# Patient Record
Sex: Female | Born: 1970
Health system: Southern US, Community
[De-identification: ages and names within clinical notes are randomized; demographics above are authoritative.]

## PROBLEM LIST (undated history)

## (undated) DIAGNOSIS — K449 Diaphragmatic hernia without obstruction or gangrene: Secondary | ICD-10-CM

## (undated) DIAGNOSIS — G4730 Sleep apnea, unspecified: Secondary | ICD-10-CM

## (undated) DIAGNOSIS — K760 Fatty (change of) liver, not elsewhere classified: Secondary | ICD-10-CM

## (undated) DIAGNOSIS — F32A Depression, unspecified: Secondary | ICD-10-CM

## (undated) DIAGNOSIS — E119 Type 2 diabetes mellitus without complications: Secondary | ICD-10-CM

## (undated) DIAGNOSIS — D649 Anemia, unspecified: Secondary | ICD-10-CM

## (undated) DIAGNOSIS — T7840XA Allergy, unspecified, initial encounter: Secondary | ICD-10-CM

## (undated) DIAGNOSIS — E669 Obesity, unspecified: Secondary | ICD-10-CM

## (undated) DIAGNOSIS — M1990 Unspecified osteoarthritis, unspecified site: Secondary | ICD-10-CM

## (undated) DIAGNOSIS — K219 Gastro-esophageal reflux disease without esophagitis: Secondary | ICD-10-CM

## (undated) DIAGNOSIS — F419 Anxiety disorder, unspecified: Secondary | ICD-10-CM

## (undated) DIAGNOSIS — M810 Age-related osteoporosis without current pathological fracture: Secondary | ICD-10-CM

## (undated) DIAGNOSIS — I10 Essential (primary) hypertension: Secondary | ICD-10-CM

## (undated) HISTORY — DX: Obesity, unspecified: E66.9

## (undated) HISTORY — DX: Anxiety disorder, unspecified: F41.9

## (undated) HISTORY — DX: Anemia, unspecified: D64.9

## (undated) HISTORY — PX: HAND SURGERY: SHX662

## (undated) HISTORY — DX: Age-related osteoporosis without current pathological fracture: M81.0

## (undated) HISTORY — DX: Unspecified osteoarthritis, unspecified site: M19.90

## (undated) HISTORY — PX: TUBAL LIGATION: SHX77

## (undated) HISTORY — DX: Diaphragmatic hernia without obstruction or gangrene: K44.9

## (undated) HISTORY — DX: Fatty (change of) liver, not elsewhere classified: K76.0

## (undated) HISTORY — DX: Allergy, unspecified, initial encounter: T78.40XA

## (undated) HISTORY — DX: Gastro-esophageal reflux disease without esophagitis: K21.9

## (undated) HISTORY — DX: Type 2 diabetes mellitus without complications: E11.9

## (undated) HISTORY — DX: Depression, unspecified: F32.A

## (undated) HISTORY — DX: Sleep apnea, unspecified: G47.30

## (undated) HISTORY — PX: COLONOSCOPY: SHX174

---

## 2017-05-26 HISTORY — PX: ABDOMINAL HYSTERECTOMY: SHX81

## 2018-08-02 LAB — HM COLONOSCOPY

## 2019-04-01 ENCOUNTER — Ambulatory Visit: Payer: Self-pay | Admitting: Adult Health Nurse Practitioner

## 2019-04-01 ENCOUNTER — Other Ambulatory Visit: Payer: Self-pay

## 2019-04-01 ENCOUNTER — Encounter: Payer: Self-pay | Admitting: Adult Health Nurse Practitioner

## 2019-04-05 ENCOUNTER — Other Ambulatory Visit: Payer: Self-pay

## 2019-04-05 ENCOUNTER — Encounter: Payer: Self-pay | Admitting: Adult Health Nurse Practitioner

## 2019-04-05 ENCOUNTER — Telehealth (INDEPENDENT_AMBULATORY_CARE_PROVIDER_SITE_OTHER): Payer: 59 | Admitting: Adult Health Nurse Practitioner

## 2019-04-05 VITALS — Ht 66.0 in | Wt 292.0 lb

## 2019-04-05 DIAGNOSIS — M5417 Radiculopathy, lumbosacral region: Secondary | ICD-10-CM

## 2019-04-05 DIAGNOSIS — M48062 Spinal stenosis, lumbar region with neurogenic claudication: Secondary | ICD-10-CM | POA: Diagnosis not present

## 2019-04-05 DIAGNOSIS — M5020 Other cervical disc displacement, unspecified cervical region: Secondary | ICD-10-CM | POA: Insufficient documentation

## 2019-04-05 MED ORDER — CITALOPRAM HYDROBROMIDE 40 MG PO TABS: 40.0000 mg | ORAL_TABLET | Freq: Every day | ORAL | 56 refills | Status: DC

## 2019-04-05 MED ORDER — METHOCARBAMOL 500 MG PO TABS: 500.0000 mg | ORAL_TABLET | Freq: Four times a day (QID) | ORAL | 0 refills | Status: DC | PRN

## 2019-04-05 MED ORDER — OMEPRAZOLE 20 MG PO CPDR: 20.0000 mg | DELAYED_RELEASE_CAPSULE | Freq: Every day | ORAL | 6 refills | Status: DC

## 2019-04-05 MED ORDER — METHYLPREDNISOLONE 4 MG PO TBPK: ORAL_TABLET | ORAL | 0 refills | Status: DC

## 2019-04-06 ENCOUNTER — Ambulatory Visit: Payer: 59

## 2019-04-08 ENCOUNTER — Encounter: Payer: Self-pay | Admitting: Adult Health Nurse Practitioner

## 2019-04-08 DIAGNOSIS — M48062 Spinal stenosis, lumbar region with neurogenic claudication: Secondary | ICD-10-CM

## 2019-04-08 HISTORY — DX: Spinal stenosis, lumbar region with neurogenic claudication: M48.062

## 2019-04-08 NOTE — Progress Notes (Signed)
Telemedicine Encounter- SOAP NOTE Established Patient  This telephone encounter was conducted with the patient's (or proxy's) verbal consent via audio telecommunications: yes/no: Yes Patient was instructed to have this encounter in a suitably private space; and to only have persons present to whom they give permission to participate. In addition, patient identity was confirmed by use of name plus two identifiers (DOB and address).  I discussed the limitations, risks, security and privacy concerns of performing an evaluation and management service by telephone and the availability of in person appointments. I also discussed with the patient that there may be a patient responsible charge related to this service. The patient expressed understanding and agreed to proceed.  I spent a total of TIME; 0 MIN TO 60 MIN: 15 minutes talking with the patient or their proxy.  Chief Complaint  Patient presents with  . New Patient (Initial Visit)  . Back Pain    Pt stated having lower back pain (herniated Disk)--painful over 1 year. Pt had cat scan in [place].    Subjective   Jasmine Wu is a 48 y.o. established patient. Telephone visit today for low back pain   HPI   Patient with a hx of low back pain x 1 year with increased pain radiating posteriorly down legs bilaterally.  Worse with changing positions.  Worse with staying in one position for too long.  Mild improvement with OTC NSAIDs.  Notes that a scan  Of abdomen showed a herniated dis at L5-S1.  Has had few exacerbations since coming here No bowel or bladder incontinence.     Patient Active Problem List   Diagnosis Date Noted  . Spinal stenosis, lumbar region, with neurogenic claudication 04/08/2019    Past Medical History:  Diagnosis Date  . Allergy   . Spinal stenosis, lumbar region, with neurogenic claudication 04/08/2019    Current Outpatient Medications  Medication Sig Dispense Refill  . citalopram (CELEXA) 40 MG  tablet Take 1 tablet (40 mg total) by mouth daily. 30 tablet 56  . Naproxen Sodium (ALEVE PO) Take by mouth.    Marland Kitchen omeprazole (PRILOSEC) 20 MG capsule Take 1 capsule (20 mg total) by mouth daily. 30 capsule 6  . Polyethylene Glycol 3350 (MIRALAX PO) Take by mouth.    . methocarbamol (ROBAXIN) 500 MG tablet Take 1 tablet (500 mg total) by mouth every 6 (six) hours as needed for muscle spasms. 30 tablet 0  . methylPREDNISolone (MEDROL DOSEPAK) 4 MG TBPK tablet As directed on pkg label 21 each 0   No current facility-administered medications for this visit.     No Known Allergies  Social History   Socioeconomic History  . Marital status: Married    Spouse name: Not on file  . Number of children: 4  . Years of education: Not on file  . Highest education level: Not on file  Occupational History  . Not on file  Social Needs  . Financial resource strain: Not on file  . Food insecurity    Worry: Not on file    Inability: Not on file  . Transportation needs    Medical: Not on file    Non-medical: Not on file  Tobacco Use  . Smoking status: Never Smoker  . Smokeless tobacco: Never Used  Substance and Sexual Activity  . Alcohol use: Not Currently    Frequency: Never  . Drug use: Not Currently  . Sexual activity: Not on file  Lifestyle  . Physical activity    Days  per week: Not on file    Minutes per session: Not on file  . Stress: Not on file  Relationships  . Social Herbalist on phone: Not on file    Gets together: Not on file    Attends religious service: Not on file    Active member of club or organization: Not on file    Attends meetings of clubs or organizations: Not on file    Relationship status: Not on file  . Intimate partner violence    Fear of current or ex partner: Not on file    Emotionally abused: Not on file    Physically abused: Not on file    Forced sexual activity: Not on file  Other Topics Concern  . Not on file  Social History Narrative  .  Not on file    ROS  General ROS: negative for - malaise, night sweats or sleep disturbance  Neurological ROS: negative for - gait disturbance or weakness + for radiating pain from back to bilateral LEs  Musculoskeletal ROS: positive for - pain in back - bilateral, generalized, lower negative for - gait disturbance or muscular weakness   Objective     GEN: , NAD, Non-toxic, Alert & Oriented x 3  PSYCH: Normally interactive. Conversant. Not depressed or anxious appearing.  Calm demeanor.    Vitals as reported by the patient: Today's Vitals   04/05/19 0937  Weight: 292 lb (132.5 kg)  Height: 5\' 6"  (1.676 m)    Jasmine was seen today for new patient (initial visit) and back pain.  Diagnoses and all orders for this visit:  Lumbosacral radiculopathy at S1 -     methocarbamol (ROBAXIN) 500 MG tablet; Take 1 tablet (500 mg total) by mouth every 6 (six) hours as needed for muscle spasms. -     Ambulatory referral to Physical Therapy  Spinal stenosis, lumbar region, with neurogenic claudication -     methocarbamol (ROBAXIN) 500 MG tablet; Take 1 tablet (500 mg total) by mouth every 6 (six) hours as needed for muscle spasms. -     Ambulatory referral to Physical Therapy  Other orders -     citalopram (CELEXA) 40 MG tablet; Take 1 tablet (40 mg total) by mouth daily. -     omeprazole (PRILOSEC) 20 MG capsule; Take 1 capsule (20 mg total) by mouth daily. -     methylPREDNISolone (MEDROL DOSEPAK) 4 MG TBPK tablet; As directed on pkg label     I discussed the assessment and treatment plan with the patient. The patient was provided an opportunity to ask questions and all were answered. The patient agreed with the plan and demonstrated an understanding of the instructions.   The patient was advised to call back or seek an in-person evaluation if the symptoms worsen or if the condition fails to improve as anticipated.  I provided 15 minutes of non-face-to-face time during this  encounter.  Glyn Ade, NP  Primary Care at [facility]

## 2019-04-18 ENCOUNTER — Ambulatory Visit (INDEPENDENT_AMBULATORY_CARE_PROVIDER_SITE_OTHER): Payer: 59 | Admitting: Family Medicine

## 2019-04-18 ENCOUNTER — Other Ambulatory Visit: Payer: Self-pay

## 2019-04-18 DIAGNOSIS — Z131 Encounter for screening for diabetes mellitus: Secondary | ICD-10-CM

## 2019-04-18 DIAGNOSIS — Z1329 Encounter for screening for other suspected endocrine disorder: Secondary | ICD-10-CM

## 2019-04-18 DIAGNOSIS — Z23 Encounter for immunization: Secondary | ICD-10-CM

## 2019-04-18 DIAGNOSIS — Z13220 Encounter for screening for lipoid disorders: Secondary | ICD-10-CM

## 2019-04-19 LAB — COMPREHENSIVE METABOLIC PANEL
ALT: 23 IU/L (ref 0–32)
AST: 16 IU/L (ref 0–40)
Albumin/Globulin Ratio: 1.6 (ref 1.2–2.2)
Albumin: 4.1 g/dL (ref 3.8–4.8)
Alkaline Phosphatase: 79 IU/L (ref 39–117)
BUN/Creatinine Ratio: 22 (ref 9–23)
BUN: 17 mg/dL (ref 6–24)
Bilirubin Total: 0.4 mg/dL (ref 0.0–1.2)
CO2: 22 mmol/L (ref 20–29)
Calcium: 9.1 mg/dL (ref 8.7–10.2)
Chloride: 101 mmol/L (ref 96–106)
Creatinine, Ser: 0.76 mg/dL (ref 0.57–1.00)
GFR calc Af Amer: 106 mL/min/{1.73_m2} (ref >59)
GFR calc non Af Amer: 92 mL/min/{1.73_m2} (ref >59)
Globulin, Total: 2.5 g/dL (ref 1.5–4.5)
Glucose: 102 mg/dL — ABNORMAL HIGH (ref 65–99)
Potassium: 4.7 mmol/L (ref 3.5–5.2)
Sodium: 138 mmol/L (ref 134–144)
Total Protein: 6.6 g/dL (ref 6.0–8.5)

## 2019-04-19 LAB — CBC WITH DIFFERENTIAL/PLATELET
Basophils Absolute: 0.1 10*3/uL (ref 0.0–0.2)
Basos: 1 %
EOS (ABSOLUTE): 0.3 10*3/uL (ref 0.0–0.4)
Eos: 4 %
Hematocrit: 40.6 % (ref 34.0–46.6)
Hemoglobin: 12.6 g/dL (ref 11.1–15.9)
Immature Grans (Abs): 0.1 10*3/uL (ref 0.0–0.1)
Immature Granulocytes: 1 %
Lymphocytes Absolute: 2.3 10*3/uL (ref 0.7–3.1)
Lymphs: 25 %
MCH: 25.8 pg — ABNORMAL LOW (ref 26.6–33.0)
MCHC: 31 g/dL — ABNORMAL LOW (ref 31.5–35.7)
MCV: 83 fL (ref 79–97)
Monocytes Absolute: 0.5 10*3/uL (ref 0.1–0.9)
Monocytes: 5 %
Neutrophils Absolute: 6 10*3/uL (ref 1.4–7.0)
Neutrophils: 64 %
Platelets: 374 10*3/uL (ref 150–450)
RBC: 4.88 x10E6/uL (ref 3.77–5.28)
RDW: 13.2 % (ref 11.7–15.4)
WBC: 9.2 10*3/uL (ref 3.4–10.8)

## 2019-04-19 LAB — LIPID PANEL
Chol/HDL Ratio: 3.5 ratio (ref 0.0–4.4)
Cholesterol, Total: 156 mg/dL (ref 100–199)
HDL: 45 mg/dL (ref >39)
LDL Chol Calc (NIH): 93 mg/dL (ref 0–99)
Triglycerides: 95 mg/dL (ref 0–149)
VLDL Cholesterol Cal: 18 mg/dL (ref 5–40)

## 2019-04-19 LAB — HEMOGLOBIN A1C
Est. average glucose Bld gHb Est-mCnc: 128 mg/dL
Hgb A1c MFr Bld: 6.1 % — ABNORMAL HIGH (ref 4.8–5.6)

## 2019-04-19 LAB — TSH: TSH: 1.27 u[IU]/mL (ref 0.450–4.500)

## 2019-05-04 ENCOUNTER — Encounter: Payer: Self-pay | Admitting: Adult Health Nurse Practitioner

## 2019-05-04 ENCOUNTER — Other Ambulatory Visit: Payer: Self-pay

## 2019-05-04 ENCOUNTER — Telehealth: Payer: 59 | Admitting: Adult Health Nurse Practitioner

## 2019-05-04 NOTE — Patient Instructions (Signed)
° ° ° °  If you have lab work done today you will be contacted with your lab results within the next 2 weeks.  If you have not heard from us then please contact us. The fastest way to get your results is to register for My Chart. ° ° °IF you received an x-ray today, you will receive an invoice from [facility]. Please contact [facility] at [phone] with questions or concerns regarding your invoice.  ° °IF you received labwork today, you will receive an invoice from [facility]. Please contact [facility] at [phone] with questions or concerns regarding your invoice.  ° °Our billing staff will not be able to assist you with questions regarding bills from these companies. ° °You will be contacted with the lab results as soon as they are available. The fastest way to get your results is to activate your My Chart account. Instructions are located on the last page of this paperwork. If you have not heard from us regarding the results in 2 weeks, please contact this office. °  ° ° ° °

## 2019-05-04 NOTE — Progress Notes (Unsigned)
On-going Back pain for over a year.   Back pain getting worse.   Wants to reschedule for later date due to connection issues with the phone.

## 2019-05-05 ENCOUNTER — Other Ambulatory Visit: Payer: Self-pay

## 2019-05-05 ENCOUNTER — Telehealth (INDEPENDENT_AMBULATORY_CARE_PROVIDER_SITE_OTHER): Payer: 59 | Admitting: Adult Health Nurse Practitioner

## 2019-05-05 ENCOUNTER — Encounter: Payer: Self-pay | Admitting: Adult Health Nurse Practitioner

## 2019-05-05 DIAGNOSIS — M5416 Radiculopathy, lumbar region: Secondary | ICD-10-CM

## 2019-05-05 HISTORY — DX: Radiculopathy, lumbar region: M54.16

## 2019-05-05 NOTE — Progress Notes (Signed)
Telemedicine Encounter- SOAP NOTE Established Patient  This telephone encounter was conducted with the patient's (or proxy's) verbal consent via audio telecommunications: yes/no: Yes Patient was instructed to have this encounter in a suitably private space; and to only have persons present to whom they give permission to participate. In addition, patient identity was confirmed by use of name plus two identifiers (DOB and address).  I discussed the limitations, risks, security and privacy concerns of performing an evaluation and management service by telephone and the availability of in person appointments. I also discussed with the patient that there may be a patient responsible charge related to this service. The patient expressed understanding and agreed to proceed.  I spent a total of TIME; 0 MIN TO 60 MIN: 10 minutes talking with the patient or their proxy.  Chief Complaint  Patient presents with  . severe back pain x 1 yr.    Pt has other issues to discuss with provider but didn't disclose those issues to [place] is a 48 y.o. established patient. Telephone visit today for chronic low back pain with radiculopathy.  HPI  Patient reports that the last steroid pack improved but as soon as the medication was completed she had a return of her pain.  Pain is actually getting worse with constant pain to her lower back bilaterally radiating posteriorly down the legs in an S1 distribution.  She cannot even stand to cook for long in her kitchen was standing up.  She is wondering about what the next step would be.  We did review physical therapy and and potential imaging.  Her current problem was only found because of an incidental finding on abdominal CT scan that she had in [place].  She has no new symptoms motor weakness or bowel bladder changes.  She does note that the Robaxin helps at night.  Encourage the patient to take ibuprofen 800 mg as needed every 8  hours as needed for her low back pain and radiation.  Patient Active Problem List   Diagnosis Date Noted  . Lumbar radiculopathy, chronic 05/05/2019    Past Medical History:  Diagnosis Date  . Allergy   . Lumbar radiculopathy, chronic 05/05/2019  . Spinal stenosis, lumbar region, with neurogenic claudication 04/08/2019    Current Outpatient Medications  Medication Sig Dispense Refill  . citalopram (CELEXA) 40 MG tablet Take 1 tablet (40 mg total) by mouth daily. 30 tablet 56  . methocarbamol (ROBAXIN) 500 MG tablet Take 1 tablet (500 mg total) by mouth every 6 (six) hours as needed for muscle spasms. 30 tablet 0  . Naproxen Sodium (ALEVE PO) Take by mouth.    Jasmine Wu Kitchen omeprazole (PRILOSEC) 20 MG capsule Take 1 capsule (20 mg total) by mouth daily. 30 capsule 6  . Polyethylene Glycol 3350 (MIRALAX PO) Take by mouth.     No current facility-administered medications for this visit.    No Known Allergies  Social History   Socioeconomic History  . Marital status: Married    Spouse name: Not on file  . Number of children: 4  . Years of education: Not on file  . Highest education level: Not on file  Occupational History  . Not on file  Tobacco Use  . Smoking status: Never Smoker  . Smokeless tobacco: Never Used  Substance and Sexual Activity  . Alcohol use: Not Currently  . Drug use: Not Currently  . Sexual activity: Not on file  Other Topics  Concern  . Not on file  Social History Narrative  . Not on file   Social Determinants of Health   Financial Resource Strain:   . Difficulty of Paying Living Expenses: Not on file  Food Insecurity:   . Worried About Programme researcher, broadcasting/film/video in the Last Year: Not on file  . Ran Out of Food in the Last Year: Not on file  Transportation Needs:   . Lack of Transportation (Medical): Not on file  . Lack of Transportation (Non-Medical): Not on file  Physical Activity:   . Days of Exercise per Week: Not on file  . Minutes of Exercise per  Session: Not on file  Stress:   . Feeling of Stress : Not on file  Social Connections:   . Frequency of Communication with Friends and Family: Not on file  . Frequency of Social Gatherings with Friends and Family: Not on file  . Attends Religious Services: Not on file  . Active Member of Clubs or Organizations: Not on file  . Attends Banker Meetings: Not on file  . Marital Status: Not on file  Intimate Partner Violence:   . Fear of Current or Ex-Partner: Not on file  . Emotionally Abused: Not on file  . Physically Abused: Not on file  . Sexually Abused: Not on file    ROS    ROS  General ROS: negative for - malaise, night sweats or sleep disturbance  Neurological ROS: negative for - gait disturbance or weakness + for radiating pain from back to bilateral LEs  Musculoskeletal ROS: positive for - pain in back - bilateral, generalized, lower negative for - gait disturbance or muscular weakness  Objective     GEN: , NAD, Non-toxic, Alert & Oriented x 3  PSYCH: Normally interactive. Conversant. Not depressed or anxious appearing.  Calm demeanor.   Vitals as reported by the patient: There were no vitals filed for this visit.  Jasmine Wu was seen today for severe back pain x 1 yr..  Diagnoses and all orders for this visit:  Lumbar radiculopathy, chronic   Given the patient's chronic problem that has lasted over 1 year and her incidental finding, would proceed with physical therapy.  It would also not be unreasonable to get neurological imaging to determine if she does have a herniated disc that is impinging her S1 nerve root.  Okay ahead and order a consult physical therapy as well as an MRI.  We will follow-up once completed and determine if surgical referral is warranted.  She is in line with this plan.    I discussed the assessment and treatment plan with the patient. The patient was provided an opportunity to ask questions and all were answered. The patient  agreed with the plan and demonstrated an understanding of the instructions.   The patient was advised to call back or seek an in-person evaluation if the symptoms worsen or if the condition fails to improve as anticipated.  I provided  minutes of non-face-to-face time during this encounter.  Elyse Jarvis, NP  Primary Care at [facility]

## 2019-05-05 NOTE — Patient Instructions (Signed)
° ° ° °  If you have lab work done today you will be contacted with your lab results within the next 2 weeks.  If you have not heard from us then please contact us. The fastest way to get your results is to register for My Chart. ° ° °IF you received an x-ray today, you will receive an invoice from [facility]. Please contact [facility] at [phone] with questions or concerns regarding your invoice.  ° °IF you received labwork today, you will receive an invoice from [facility]. Please contact [facility] at [phone] with questions or concerns regarding your invoice.  ° °Our billing staff will not be able to assist you with questions regarding bills from these companies. ° °You will be contacted with the lab results as soon as they are available. The fastest way to get your results is to activate your My Chart account. Instructions are located on the last page of this paperwork. If you have not heard from us regarding the results in 2 weeks, please contact this office. °  ° ° ° °

## 2019-05-05 NOTE — Progress Notes (Signed)
CC: Severe back pain x 1 year. Pain level 8/10, pain is constant and dull.  Per pt she has other issues she would like to discuss with provider but did not disclose.  No travel outside the [place] or [place] in the past 3 weeks.

## 2019-05-10 ENCOUNTER — Encounter: Payer: Self-pay | Admitting: Family Medicine

## 2019-05-11 NOTE — Telephone Encounter (Signed)
PT CALLED IN REGARD TO REFERRAL. PLEASE PROCESS FOR PT

## 2019-05-12 ENCOUNTER — Other Ambulatory Visit: Payer: 59

## 2019-05-18 ENCOUNTER — Ambulatory Visit: Payer: 59 | Admitting: Family Medicine

## 2019-05-18 ENCOUNTER — Ambulatory Visit: Payer: 59 | Admitting: Adult Health Nurse Practitioner

## 2019-05-23 ENCOUNTER — Other Ambulatory Visit: Payer: 59

## 2019-05-24 ENCOUNTER — Ambulatory Visit
Admission: RE | Admit: 2019-05-24 | Discharge: 2019-05-24 | Disposition: A | Discharge status: Home / Self Care | Payer: 59 | Source: Ambulatory Visit | Attending: Adult Health Nurse Practitioner | Admitting: Adult Health Nurse Practitioner

## 2019-05-24 DIAGNOSIS — M5416 Radiculopathy, lumbar region: Secondary | ICD-10-CM

## 2019-06-02 ENCOUNTER — Telehealth (INDEPENDENT_AMBULATORY_CARE_PROVIDER_SITE_OTHER): Payer: 59 | Admitting: Adult Health Nurse Practitioner

## 2019-06-02 ENCOUNTER — Other Ambulatory Visit: Payer: Self-pay

## 2019-06-02 DIAGNOSIS — M47817 Spondylosis without myelopathy or radiculopathy, lumbosacral region: Secondary | ICD-10-CM | POA: Diagnosis not present

## 2019-06-02 DIAGNOSIS — M5417 Radiculopathy, lumbosacral region: Secondary | ICD-10-CM

## 2019-06-02 DIAGNOSIS — Z712 Person consulting for explanation of examination or test findings: Secondary | ICD-10-CM

## 2019-06-02 NOTE — Patient Instructions (Signed)
° ° ° °  If you have lab work done today you will be contacted with your lab results within the next 2 weeks.  If you have not heard from us then please contact us. The fastest way to get your results is to register for My Chart. ° ° °IF you received an x-ray today, you will receive an invoice from [facility]. Please contact [facility] at [phone] with questions or concerns regarding your invoice.  ° °IF you received labwork today, you will receive an invoice from [facility]. Please contact [facility] at [phone] with questions or concerns regarding your invoice.  ° °Our billing staff will not be able to assist you with questions regarding bills from these companies. ° °You will be contacted with the lab results as soon as they are available. The fastest way to get your results is to activate your My Chart account. Instructions are located on the last page of this paperwork. If you have not heard from us regarding the results in 2 weeks, please contact this office. °  ° ° ° °

## 2019-06-08 ENCOUNTER — Telehealth: Payer: Self-pay | Admitting: Adult Health Nurse Practitioner

## 2019-06-08 NOTE — Telephone Encounter (Signed)
Can you please sign the notes for encounter 06/02/19 so that I may send referral? Thanks

## 2019-06-09 NOTE — Telephone Encounter (Signed)
Please Advise

## 2019-06-13 ENCOUNTER — Encounter: Payer: Self-pay | Admitting: Family Medicine

## 2019-06-13 ENCOUNTER — Ambulatory Visit: Payer: 59

## 2019-06-13 NOTE — Telephone Encounter (Signed)
Can you please sign the notes from 01/07 so I can send referral  Thanks

## 2019-06-13 NOTE — Telephone Encounter (Signed)
Please sign the 06/02/19 office visit so the patient's referral can be completed.

## 2019-06-14 NOTE — Telephone Encounter (Signed)
Please Advise

## 2019-06-16 NOTE — Telephone Encounter (Signed)
Please have the provider sign the 06/02/19 office visit so the patient's referral can be completed.

## 2019-06-16 NOTE — Telephone Encounter (Signed)
done

## 2019-06-16 NOTE — Progress Notes (Signed)
Telemedicine Encounter- SOAP NOTE Established Patient  This telephone encounter was conducted with the patient's (or proxy's) verbal consent via audio telecommunications: yes/no: Yes Patient was instructed to have this encounter in a suitably private space; and to only have persons present to whom they give permission to participate. In addition, patient identity was confirmed by use of name plus two identifiers (DOB and address).  I discussed the limitations, risks, security and privacy concerns of performing an evaluation and management service by telephone and the availability of in person appointments. I also discussed with the patient that there may be a patient responsible charge related to this service. The patient expressed understanding and agreed to proceed.  I spent a total of TIME; 0 MIN TO 60 MIN: 15 minutes talking with the patient or their proxy.  Chief Complaint  Patient presents with  . Results    MRI    Subjective   Jasmine Wu is a 49 y.o. established patient. Telephone visit today for MRI results.  HPI   Patient is still having nearly constant pain in her lower back.  Some S1 radicular symptoms.  We discussed the results of the MRI report below.    Patient Active Problem List   Diagnosis Date Noted  . Lumbar radiculopathy, chronic 05/05/2019    Past Medical History:  Diagnosis Date  . Allergy   . Lumbar radiculopathy, chronic 05/05/2019  . Spinal stenosis, lumbar region, with neurogenic claudication 04/08/2019    Current Outpatient Medications  Medication Sig Dispense Refill  . citalopram (CELEXA) 40 MG tablet Take 1 tablet (40 mg total) by mouth daily. 30 tablet 56  . methocarbamol (ROBAXIN) 500 MG tablet Take 1 tablet (500 mg total) by mouth every 6 (six) hours as needed for muscle spasms. 30 tablet 0  . Naproxen Sodium (ALEVE PO) Take by mouth.    Marland Kitchen omeprazole (PRILOSEC) 20 MG capsule Take 1 capsule (20 mg total) by mouth daily. 30 capsule 6    . Polyethylene Glycol 3350 (MIRALAX PO) Take by mouth.     No current facility-administered medications for this visit.    No Known Allergies  Social History   Socioeconomic History  . Marital status: Married    Spouse name: Not on file  . Number of children: 4  . Years of education: Not on file  . Highest education level: Not on file  Occupational History  . Not on file  Tobacco Use  . Smoking status: Never Smoker  . Smokeless tobacco: Never Used  Substance and Sexual Activity  . Alcohol use: Not Currently  . Drug use: Not Currently  . Sexual activity: Not on file  Other Topics Concern  . Not on file  Social History Narrative  . Not on file   Social Determinants of Health   Financial Resource Strain:   . Difficulty of Paying Living Expenses: Not on file  Food Insecurity:   . Worried About Charity fundraiser in the Last Year: Not on file  . Ran Out of Food in the Last Year: Not on file  Transportation Needs:   . Lack of Transportation (Medical): Not on file  . Lack of Transportation (Non-Medical): Not on file  Physical Activity:   . Days of Exercise per Week: Not on file  . Minutes of Exercise per Session: Not on file  Stress:   . Feeling of Stress : Not on file  Social Connections:   . Frequency of Communication with Friends and Family: Not  on file  . Frequency of Social Gatherings with Friends and Family: Not on file  . Attends Religious Services: Not on file  . Active Member of Clubs or Organizations: Not on file  . Attends Banker Meetings: Not on file  . Marital Status: Not on file  Intimate Partner Violence:   . Fear of Current or Ex-Partner: Not on file  . Emotionally Abused: Not on file  . Physically Abused: Not on file  . Sexually Abused: Not on file    ROS   Review of Systems See HPI Constitution: No fevers or chills No malaise No diaphoresis Skin: No rash or itching Eyes: no blurry vision, no double vision GU: no dysuria or  hematuria Neuro: no dizziness or headaches   Objective    General appearance: oriented to person, place, and time. Mental Status: normal mood, behavior, speech, dress, motor activity, and thought processes.   Personally reviewed MRI of the lumbar spine which shows:   No central canal stenosis.  Facet hypertrophy at L4-L5, L5-S1, Mild foraminal stenosis R>L at L4-S1.   Vitals as reported by the patient: There were no vitals filed for this visit.  Jasmine Wu was seen today for results.  Diagnoses and all orders for this visit:  Lumbosacral radiculopathy at S1 -     Ambulatory referral to Neurosurgery -     Ambulatory referral to Physical Therapy  Facet joint disease of lumbosacral region -     Ambulatory referral to Neurosurgery -     Ambulatory referral to Physical Therapy   Reviewed symptoms and treatment to date.  Recommend the above.  She is inline with this plan.   I discussed the assessment and treatment plan with the patient. The patient was provided an opportunity to ask questions and all were answered. The patient agreed with the plan and demonstrated an understanding of the instructions.   The patient was advised to call back or seek an in-person evaluation if the symptoms worsen or if the condition fails to improve as anticipated.  I provided 15 minutes of non-face-to-face time during this encounter.  Elyse Jarvis, NP  Primary Care at [facility]

## 2019-06-16 NOTE — Telephone Encounter (Signed)
Please sign

## 2019-07-19 ENCOUNTER — Other Ambulatory Visit: Payer: Self-pay

## 2019-07-19 ENCOUNTER — Encounter: Payer: Self-pay | Admitting: Adult Health Nurse Practitioner

## 2019-07-19 ENCOUNTER — Ambulatory Visit: Payer: 59 | Admitting: Adult Health Nurse Practitioner

## 2019-07-19 VITALS — BP 158/96 | HR 91 | Temp 98.0°F | Ht 64.0 in | Wt 304.4 lb

## 2019-07-19 DIAGNOSIS — F4323 Adjustment disorder with mixed anxiety and depressed mood: Secondary | ICD-10-CM

## 2019-07-19 DIAGNOSIS — I10 Essential (primary) hypertension: Secondary | ICD-10-CM

## 2019-07-19 MED ORDER — LOSARTAN POTASSIUM-HCTZ 100-25 MG PO TABS: 1.0000 | ORAL_TABLET | Freq: Every day | ORAL | 3 refills | Status: DC

## 2019-07-19 MED ORDER — QUETIAPINE FUMARATE 25 MG PO TABS: 25.0000 mg | ORAL_TABLET | Freq: Every day | ORAL | 3 refills | Status: DC

## 2019-07-19 MED ORDER — PAROXETINE HCL 30 MG PO TABS: 30.0000 mg | ORAL_TABLET | Freq: Every day | ORAL | 3 refills | Status: DC

## 2019-07-19 NOTE — Patient Instructions (Addendum)
   Managing Your Hypertension Hypertension is commonly called high blood pressure. This is when the force of your blood pressing against the walls of your arteries is too strong. Arteries are blood vessels that carry blood from your heart throughout your body. Hypertension forces the heart to work harder to pump blood, and may cause the arteries to become narrow or stiff. Having untreated or uncontrolled hypertension can cause heart attack, stroke, kidney disease, and other problems. What are blood pressure readings? A blood pressure reading consists of a higher number over a lower number. Ideally, your blood pressure should be below 120/80. The first ("top") number is called the systolic pressure. It is a measure of the pressure in your arteries as your heart beats. The second ("bottom") number is called the diastolic pressure. It is a measure of the pressure in your arteries as the heart relaxes. What does my blood pressure reading mean? Blood pressure is classified into four stages. Based on your blood pressure reading, your health care provider may use the following stages to determine what type of treatment you need, if any. Systolic pressure and diastolic pressure are measured in a unit called mm Hg. Normal  Systolic pressure: below 120.  Diastolic pressure: below 80. Elevated  Systolic pressure: 120-129.  Diastolic pressure: below 80. Hypertension stage 1  Systolic pressure: 130-139.  Diastolic pressure: 80-89. Hypertension stage 2  Systolic pressure: 140 or above.  Diastolic pressure: 90 or above. What health risks are associated with hypertension? Managing your hypertension is an important responsibility. Uncontrolled hypertension can lead to:  A heart attack.  A stroke.  A weakened blood vessel (aneurysm).  Heart failure.  Kidney damage.  Eye damage.  Metabolic syndrome.  Memory and concentration problems. What changes can I make to manage my  hypertension? Hypertension can be managed by making lifestyle changes and possibly by taking medicines. Your health care provider will help you make a plan to bring your blood pressure within a normal range. Eating and drinking   Eat a diet that is high in fiber and potassium, and low in salt (sodium), added sugar, and fat. An example eating plan is called the DASH (Dietary Approaches to Stop Hypertension) diet. To eat this way: ? Eat plenty of fresh fruits and vegetables. Try to fill half of your plate at each meal with fruits and vegetables. ? Eat whole grains, such as whole wheat pasta, brown rice, or whole grain bread. Fill about one quarter of your plate with whole grains. ? Eat low-fat diary products. ? Avoid fatty cuts of meat, processed or cured meats, and poultry with skin. Fill about one quarter of your plate with lean proteins such as fish, chicken without skin, beans, eggs, and tofu. ? Avoid premade and processed foods. These tend to be higher in sodium, added sugar, and fat.  Reduce your daily sodium intake. Most people with hypertension should eat less than 1,500 mg of sodium a day.  Limit alcohol intake to no more than 1 drink a day for nonpregnant women and 2 drinks a day for men. One drink equals 12 oz of beer, 5 oz of wine, or 1 oz of hard liquor. Lifestyle  Work with your health care provider to maintain a healthy body weight, or to lose weight. Ask what an ideal weight is for you.  Get at least 30 minutes of exercise that causes your heart to beat faster (aerobic exercise) most days of the week. Activities may include walking, swimming, or biking.    Include exercise to strengthen your muscles (resistance exercise), such as weight lifting, as part of your weekly exercise routine. Try to do these types of exercises for 30 minutes at least 3 days a week.  Do not use any products that contain nicotine or tobacco, such as cigarettes and e-cigarettes. If you need help quitting,  ask your health care provider.  Control any long-term (chronic) conditions you have, such as high cholesterol or diabetes. Monitoring  Monitor your blood pressure at home as told by your health care provider. Your personal target blood pressure may vary depending on your medical conditions, your age, and other factors.  Have your blood pressure checked regularly, as often as told by your health care provider. Working with your health care provider  Review all the medicines you take with your health care provider because there may be side effects or interactions.  Talk with your health care provider about your diet, exercise habits, and other lifestyle factors that may be contributing to hypertension.  Visit your health care provider regularly. Your health care provider can help you create and adjust your plan for managing hypertension. Will I need medicine to control my blood pressure? Your health care provider may prescribe medicine if lifestyle changes are not enough to get your blood pressure under control, and if:  Your systolic blood pressure is 130 or higher.  Your diastolic blood pressure is 80 or higher. Take medicines only as told by your health care provider. Follow the directions carefully. Blood pressure medicines must be taken as prescribed. The medicine does not work as well when you skip doses. Skipping doses also puts you at risk for problems. Contact a health care provider if:  You think you are having a reaction to medicines you have taken.  You have repeated (recurrent) headaches.  You feel dizzy.  You have swelling in your ankles.  You have trouble with your vision. Get help right away if:  You develop a severe headache or confusion.  You have unusual weakness or numbness, or you feel faint.  You have severe pain in your chest or abdomen.  You vomit repeatedly.  You have trouble breathing. Summary  Hypertension is when the force of blood pumping  through your arteries is too strong. If this condition is not controlled, it may put you at risk for serious complications.  Your personal target blood pressure may vary depending on your medical conditions, your age, and other factors. For most people, a normal blood pressure is less than 120/80.  Hypertension is managed by lifestyle changes, medicines, or both. Lifestyle changes include weight loss, eating a healthy, low-sodium diet, exercising more, and limiting alcohol. This information is not intended to replace advice given to you by your health care provider. Make sure you discuss any questions you have with your health care provider. Document Revised: 09/03/2018 Document Reviewed: 04/09/2016 Elsevier Patient Education  2020 Elsevier Inc.  

## 2019-07-19 NOTE — Progress Notes (Signed)
07/19/2019  French Ana 08-20-1970 [id number]  Subjective:  Jasmine Wu is a 49 y.o. female with anxiety/depression, and new onset elevated BP.   Reports that she has had a lot going on at home.  Increased anxiety and depression but primarily anxiety.  Major move from [place] due to husband losing job.  Patient lost house.  Cousin had a stroke, aunt has a mass in lung, and daughter's fiance just called off wedding this past weekend.  She finds herself crying and not sleeping.  Cannot shut off her mind.  She is going to seek counseling through her employment.   She has been on Citalopram for about 15 years and does not feel that it is effective any longer.  The repercussions from not sleeping are making patient more emotional.   She does endorse some chest pain--unsure if organic or from anxiety.  Her father had an MI at 74.  Lipids checked in the fall were normal.      Current Outpatient Medications  Medication Sig Dispense Refill  . methocarbamol (ROBAXIN) 500 MG tablet Take 1 tablet (500 mg total) by mouth every 6 (six) hours as needed for muscle spasms. 30 tablet 0  . Naproxen Sodium (ALEVE PO) Take by mouth.    Marland Kitchen omeprazole (PRILOSEC) 20 MG capsule Take 1 capsule (20 mg total) by mouth daily. 30 capsule 6  . Polyethylene Glycol 3350 (MIRALAX PO) Take by mouth.    . citalopram (CELEXA) 40 MG tablet Take 1 tablet (40 mg total) by mouth daily. (Patient not taking: Reported on 07/19/2019) 30 tablet 56   No current facility-administered medications for this visit.     General ROS: positive for  - malaise and sleep disturbance negative for - chills, hot flashes or night sweats Respiratory ROS: positive for - shortness of breath negative for - cough, orthopnea or tachypnea  Hypertension ROS: noting swelling of ankles.  New concerns: chest pain without radiation, some DOE intermittently. Marland Kitchen  Psychological ROS: positive for - anxiety, concentration difficulties, depression  and sleep disturbances negative for - memory difficulties, mood swings or obsessive thoughts   GAD 7 : Generalized Anxiety Score 07/19/2019  Nervous, Anxious, on Edge 1  Control/stop worrying 3  Worry too much - different things 3  Trouble relaxing 1  Restless 1  Easily annoyed or irritable 3  Afraid - awful might happen 0  Total GAD 7 Score 12      Office Visit from 07/19/2019 in Primary Care at [facility]  PHQ-9 Total Score  9       Objective:  BP (!) 158/96 (BP Location: Right Arm, Patient Position: Sitting, Cuff Size: Large)   Pulse 91   Temp 98 F (36.7 C) (Temporal)   Ht 5\' 4"  (1.626 m)   Wt (!) 304 lb 6.4 oz (138.1 kg)   SpO2 96%   BMI 52.25 kg/m   Appearance oriented to person, place, and time and crying. General exam BP noted to be moderately elevated today in office, S1, S2 normal, no gallop, no murmur, chest clear, no JVD, no HSM, no edema, CVS exam  - normal rate, regular rhythm, normal S1, S2, no murmurs, rubs, clicks or gallops.  Lab review: labs are reviewed, up to date and normal.   Assessment:   Hypertension needs improvement.  1. Essential hypertension   2. Adjustment disorder with mixed anxiety and depressed mood    Meds ordered this encounter  Medications  . losartan-hydrochlorothiazide (HYZAAR) 100-25 MG tablet  Sig: Take 1 tablet by mouth daily.    Dispense:  90 tablet    Refill:  3  . PARoxetine (PAXIL) 30 MG tablet    Sig: Take 1 tablet (30 mg total) by mouth daily.    Dispense:  30 tablet    Refill:  3  . QUEtiapine (SEROQUEL) 25 MG tablet    Sig: Take 1 tablet (25 mg total) by mouth at bedtime.    Dispense:  30 tablet    Refill:  3   Reviewed side effects of SSRIs and sleep medications.  Informed Seroquel could make patient fatigued initially.  Recommended trying on the weekend first and giving self 8 hours to sleep.  Paxil may be more effective for anxiety though if no improvement, would consider Effexor.    Discussed getting some BP  readings at home as she does have a cuff.  It would help in managing medication based on accurate BP readings in the future.  She is inline with this plan.  When she returns in 3 weeks, would repeat labs.  She is inline with this plan.   Elyse Jarvis, NP   Plan:  Lab results and schedule of future lab studies reviewed with patient. Orders and follow up as documented in patient record. Reviewed medications and side effects in detail. Follow up: 1 month and as needed.Marland Kitchen

## 2019-08-16 ENCOUNTER — Ambulatory Visit: Payer: 59 | Admitting: Adult Health Nurse Practitioner

## 2019-09-13 ENCOUNTER — Other Ambulatory Visit: Payer: Self-pay

## 2019-09-13 ENCOUNTER — Encounter: Payer: Self-pay | Admitting: Adult Health Nurse Practitioner

## 2019-09-13 ENCOUNTER — Telehealth (INDEPENDENT_AMBULATORY_CARE_PROVIDER_SITE_OTHER): Payer: 59 | Admitting: Adult Health Nurse Practitioner

## 2019-09-13 VITALS — BP 111/70 | Ht 64.0 in | Wt 300.0 lb

## 2019-09-13 DIAGNOSIS — L309 Dermatitis, unspecified: Secondary | ICD-10-CM

## 2019-09-13 MED ORDER — HYDROXYZINE PAMOATE 25 MG PO CAPS: 25.0000 mg | ORAL_CAPSULE | Freq: Three times a day (TID) | ORAL | 0 refills | Status: AC | PRN

## 2019-09-13 MED ORDER — METHYLPREDNISOLONE 4 MG PO TBPK: ORAL_TABLET | ORAL | 0 refills | Status: DC

## 2019-09-13 NOTE — Progress Notes (Signed)
Virtual Visit via Video Note  I connected with Jasmine Wu on 09/13/19 at  3:50 PM EDT by a video enabled telemedicine application and verified that I am speaking with the correct person using two identifiers.  Location: Patient: home Provider: work   I discussed the limitations of evaluation and management by telemedicine and the availability of in person appointments. The patient expressed understanding and agreed to proceed.  History of Present Illness: Patient has a recent history of a rash that initially started on her wrist and then spread to the other areas of her body including her face, chest, upper back, all in the upper torso.  It is itchy, red, raised and she has had some weeping from the lesions.  The area on her chest is burning.  She does not know of any new exposures.  She developed this rash 3 days after she received the Covid vaccine and is questioning whether that could have caused it.   Observations/Objective: General appearance: alert, well appearing, and in no distress. Skin exam - can see areas of raised erythematous rash through the videol unable to ascertain if they are weeping.   Assessment and Plan:  1. Dermatitis     Follow Up Instructions:   No orders of the defined types were placed in this encounter.   I discussed the assessment and treatment plan with the patient. The patient was provided an opportunity to ask questions and all were answered. The patient agreed with the plan and demonstrated an understanding of the instructions.   The patient was advised to call back or seek an in-person evaluation if the symptoms worsen or if the condition fails to improve as anticipated.  I provided 15 minutes of non-face-to-face time during this encounter.   Elyse Jarvis, NP

## 2019-09-13 NOTE — Patient Instructions (Signed)
° ° ° °  If you have lab work done today you will be contacted with your lab results within the next 2 weeks.  If you have not heard from us then please contact us. The fastest way to get your results is to register for My Chart. ° ° °IF you received an x-ray today, you will receive an invoice from [facility]. Please contact [facility] at [phone] with questions or concerns regarding your invoice.  ° °IF you received labwork today, you will receive an invoice from [facility]. Please contact [facility] at [phone] with questions or concerns regarding your invoice.  ° °Our billing staff will not be able to assist you with questions regarding bills from these companies. ° °You will be contacted with the lab results as soon as they are available. The fastest way to get your results is to activate your My Chart account. Instructions are located on the last page of this paperwork. If you have not heard from us regarding the results in 2 weeks, please contact this office. °  ° ° ° °

## 2019-09-27 ENCOUNTER — Ambulatory Visit: Payer: 59 | Admitting: Adult Health Nurse Practitioner

## 2019-10-03 DIAGNOSIS — L309 Dermatitis, unspecified: Secondary | ICD-10-CM | POA: Insufficient documentation

## 2019-11-02 ENCOUNTER — Other Ambulatory Visit: Payer: Self-pay

## 2019-11-02 ENCOUNTER — Telehealth: Payer: Self-pay | Admitting: Adult Health Nurse Practitioner

## 2019-11-02 MED ORDER — OMEPRAZOLE 20 MG PO CPDR: 20.0000 mg | DELAYED_RELEASE_CAPSULE | Freq: Every day | ORAL | 6 refills | Status: DC

## 2019-11-02 NOTE — Telephone Encounter (Signed)
omeprazole (PRILOSEC) 20 MG capsule [[id number]]   Patient stated that she received a txt from her pharmacy and was told that they needed approval from pcp to fill the prescription  Please advise she has a TOC scheduled with morrow in July

## 2019-11-03 ENCOUNTER — Other Ambulatory Visit: Payer: Self-pay

## 2019-11-03 DIAGNOSIS — F4323 Adjustment disorder with mixed anxiety and depressed mood: Secondary | ICD-10-CM

## 2019-11-03 MED ORDER — PAROXETINE HCL 30 MG PO TABS: 30.0000 mg | ORAL_TABLET | Freq: Every day | ORAL | 3 refills | Status: DC

## 2019-11-29 ENCOUNTER — Encounter: Payer: 59 | Admitting: Registered Nurse

## 2019-12-06 ENCOUNTER — Encounter: Payer: 59 | Admitting: Registered Nurse

## 2019-12-21 ENCOUNTER — Other Ambulatory Visit: Payer: Self-pay

## 2019-12-22 LAB — CMP12+LP+TP+TSH+6AC+CBC/D/PLT
ALT: 34 IU/L — ABNORMAL HIGH (ref 0–32)
AST: 18 IU/L (ref 0–40)
Albumin/Globulin Ratio: 1.6 (ref 1.2–2.2)
Albumin: 4.1 g/dL (ref 3.8–4.9)
Alkaline Phosphatase: 77 IU/L (ref 48–121)
BUN/Creatinine Ratio: 21 (ref 9–23)
BUN: 13 mg/dL (ref 6–24)
Basophils Absolute: 0.1 10*3/uL (ref 0.0–0.2)
Basos: 1 %
Bilirubin Total: 0.3 mg/dL (ref 0.0–1.2)
Calcium: 8.9 mg/dL (ref 8.7–10.2)
Chloride: 99 mmol/L (ref 96–106)
Chol/HDL Ratio: 5 ratio — ABNORMAL HIGH (ref 0.0–4.4)
Cholesterol, Total: 164 mg/dL (ref 100–199)
Creatinine, Ser: 0.62 mg/dL (ref 0.57–1.00)
EOS (ABSOLUTE): 0.4 10*3/uL (ref 0.0–0.4)
Eos: 4 %
Estimated CHD Risk: 1.3 times avg. — ABNORMAL HIGH (ref 0.0–1.0)
Free Thyroxine Index: 1.8 (ref 1.2–4.9)
GFR calc Af Amer: 121 mL/min/{1.73_m2} (ref >59)
GFR calc non Af Amer: 105 mL/min/{1.73_m2} (ref >59)
GGT: 25 IU/L (ref 0–60)
Globulin, Total: 2.6 g/dL (ref 1.5–4.5)
Glucose: 103 mg/dL — ABNORMAL HIGH (ref 65–99)
HDL: 33 mg/dL — ABNORMAL LOW (ref >39)
Hematocrit: 39.5 % (ref 34.0–46.6)
Hemoglobin: 12.7 g/dL (ref 11.1–15.9)
Immature Grans (Abs): 0.1 10*3/uL (ref 0.0–0.1)
Immature Granulocytes: 1 %
Iron: 51 ug/dL (ref 27–159)
LDH: 206 IU/L (ref 119–226)
LDL Chol Calc (NIH): 112 mg/dL — ABNORMAL HIGH (ref 0–99)
Lymphocytes Absolute: 2.2 10*3/uL (ref 0.7–3.1)
Lymphs: 23 %
MCH: 27 pg (ref 26.6–33.0)
MCHC: 32.2 g/dL (ref 31.5–35.7)
MCV: 84 fL (ref 79–97)
Monocytes Absolute: 0.5 10*3/uL (ref 0.1–0.9)
Monocytes: 5 %
Neutrophils Absolute: 6.4 10*3/uL (ref 1.4–7.0)
Neutrophils: 66 %
Phosphorus: 2.5 mg/dL — ABNORMAL LOW (ref 3.0–4.3)
Platelets: 383 10*3/uL (ref 150–450)
Potassium: 4.5 mmol/L (ref 3.5–5.2)
RBC: 4.71 x10E6/uL (ref 3.77–5.28)
RDW: 12.8 % (ref 11.7–15.4)
Sodium: 138 mmol/L (ref 134–144)
T3 Uptake Ratio: 23 % — ABNORMAL LOW (ref 24–39)
T4, Total: 7.7 ug/dL (ref 4.5–12.0)
TSH: 1.35 u[IU]/mL (ref 0.450–4.500)
Total Protein: 6.7 g/dL (ref 6.0–8.5)
Triglycerides: 100 mg/dL (ref 0–149)
Uric Acid: 4.5 mg/dL (ref 3.0–7.2)
VLDL Cholesterol Cal: 19 mg/dL (ref 5–40)
WBC: 9.6 10*3/uL (ref 3.4–10.8)

## 2019-12-28 ENCOUNTER — Telehealth: Payer: Self-pay | Admitting: Adult Health Nurse Practitioner

## 2019-12-28 NOTE — Telephone Encounter (Signed)
Called pt again, reached pt and was able to resch appt.

## 2019-12-28 NOTE — Telephone Encounter (Signed)
LVMTCB for pt to resch toc appt that she did via MyChart because provider is already over booked for that type of appt.

## 2020-01-01 ENCOUNTER — Other Ambulatory Visit: Payer: Self-pay | Admitting: Registered Nurse

## 2020-01-01 DIAGNOSIS — F4323 Adjustment disorder with mixed anxiety and depressed mood: Secondary | ICD-10-CM

## 2020-01-03 ENCOUNTER — Encounter: Payer: 59 | Admitting: Registered Nurse

## 2020-01-23 ENCOUNTER — Encounter: Payer: 59 | Admitting: Registered Nurse

## 2020-01-24 ENCOUNTER — Encounter: Payer: 59 | Admitting: Registered Nurse

## 2020-01-26 ENCOUNTER — Other Ambulatory Visit: Payer: Self-pay | Admitting: Family Medicine

## 2020-01-26 NOTE — Telephone Encounter (Signed)
Requested medication (s) are due for refill today:  no  Requested medication (s) are on the active medication list:  No  Future visit scheduled:  Yes  Last Refill: not on Pantoprazole; see pharmacy request to change to alternative medication.  Notes to clinic: has been on Omeprazole.  Pharmacy reports pt. Is requesting a lower cost out of pocket alternative.   Requested Prescriptions  Pending Prescriptions Disp Refills   pantoprazole (PROTONIX) 40 MG tablet [Pharmacy Med Name: PANTOPRAZOLE SOD DR 40 MG TAB] 30 tablet 0      Gastroenterology: Proton Pump Inhibitors Passed - 01/26/2020  2:58 PM      Passed - Valid encounter within last 12 months    Recent Outpatient Visits           4 months ago Dermatitis   Primary Care at [facility], Lonna Cobb, NP   6 months ago Essential hypertension   Primary Care at [facility], Lonna Cobb, NP   7 months ago Lumbosacral radiculopathy at S1   Primary Care at [facility], Lonna Cobb, NP   8 months ago Lumbar radiculopathy, chronic   Primary Care at [facility], Lonna Cobb, NP   8 months ago    Primary Care at [facility], Lonna Cobb, NP       Future Appointments             In 3 weeks Janeece Agee, NP Primary Care at [place], [facility], [place]

## 2020-02-21 ENCOUNTER — Telehealth (INDEPENDENT_AMBULATORY_CARE_PROVIDER_SITE_OTHER): Payer: 59 | Admitting: Registered Nurse

## 2020-02-21 ENCOUNTER — Encounter: Payer: Self-pay | Admitting: Registered Nurse

## 2020-02-21 ENCOUNTER — Other Ambulatory Visit: Payer: Self-pay

## 2020-02-21 VITALS — Ht 64.0 in | Wt 292.0 lb

## 2020-02-21 DIAGNOSIS — Z7689 Persons encountering health services in other specified circumstances: Secondary | ICD-10-CM | POA: Diagnosis not present

## 2020-02-21 DIAGNOSIS — R609 Edema, unspecified: Secondary | ICD-10-CM

## 2020-02-21 NOTE — Progress Notes (Signed)
Telemedicine Encounter- SOAP NOTE Established Patient  This telephone encounter was conducted with the patient's (or proxy's) verbal consent via audio telecommunications: yes  Patient was instructed to have this encounter in a suitably private space; and to only have persons present to whom they give permission to participate. In addition, patient identity was confirmed by use of name plus two identifiers (DOB and address).  I discussed the limitations, risks, security and privacy concerns of performing an evaluation and management service by telephone and the availability of in person appointments. I also discussed with the patient that there may be a patient responsible charge related to this service. The patient expressed understanding and agreed to proceed.  I spent a total of 18 minutes talking with the patient or their proxy.  Patient at home Provider in office  Chief Complaint  Patient presents with  . Transitions Of Care    Pt stated that she has some swellingin her both ankles for the past couple of months. She is still able to bare wt on them w/o any complaints.    Subjective   Jasmine Wu is a 49 y.o. established patient. Telephone visit today for visit to est care  HPI Switched to virtual d/t potential covid symptoms. Patient is awaiting test results Went to [place] for a wedding last week. Came home and developed some mild respiratory symptoms - confident it is a transient bronchitis but wanted to be safe. Denies further covid symptoms or known exposure. Has completed 2 shot vaccination  Histories reviewed with patient  Notes that she has had some swelling in her legs over the past 2-3 mos Slow onset No pain No redness or heat No wounds / skin integrity intact Has not happened before Denies chest pain, shob, doe, headaches, visual changes, lightheadedness, dizziness, claudication Swelling is worse as day goes on Her mother is a Engineer, civil (consulting) - has deemed it to be pitting  edema based on impression left after digital indentation Notes that toenails have seemed more brittle and flaky lately No changes to hair distribution or growth on legs  Strong fam hx of CV concerns including father with PVD  Patient Active Problem List   Diagnosis Date Noted  . Dermatitis 10/03/2019  . Essential hypertension 07/19/2019  . Adjustment disorder with mixed anxiety and depressed mood 07/19/2019  . Lumbar radiculopathy, chronic 05/05/2019    Past Medical History:  Diagnosis Date  . Allergy   . Lumbar radiculopathy, chronic 05/05/2019  . Spinal stenosis, lumbar region, with neurogenic claudication 04/08/2019    Current Outpatient Medications  Medication Sig Dispense Refill  . losartan-hydrochlorothiazide (HYZAAR) 100-25 MG tablet Take 1 tablet by mouth daily. 90 tablet 3  . pantoprazole (PROTONIX) 40 MG tablet Take 1 tablet (40 mg total) by mouth daily. 90 tablet 3  . PARoxetine (PAXIL) 30 MG tablet TAKE 1 TABLET BY MOUTH EVERY DAY 90 tablet 2  . Polyethylene Glycol 3350 (MIRALAX PO) Take by mouth.    . citalopram (CELEXA) 40 MG tablet Take 1 tablet (40 mg total) by mouth daily. 30 tablet 56  . methocarbamol (ROBAXIN) 500 MG tablet Take 1 tablet (500 mg total) by mouth every 6 (six) hours as needed for muscle spasms. 30 tablet 0  . methylPREDNISolone (MEDROL DOSEPAK) 4 MG TBPK tablet As directed on pkg label 21 each 0  . Naproxen Sodium (ALEVE PO) Take by mouth.    . QUEtiapine (SEROQUEL) 25 MG tablet Take 1 tablet (25 mg total) by mouth at bedtime. 30  tablet 3   No current facility-administered medications for this visit.    No Known Allergies  Social History   Socioeconomic History  . Marital status: Married    Spouse name: Not on file  . Number of children: 4  . Years of education: Not on file  . Highest education level: Not on file  Occupational History  . Not on file  Tobacco Use  . Smoking status: Never Smoker  . Smokeless tobacco: Never Used    Vaping Use  . Vaping Use: Never used  Substance and Sexual Activity  . Alcohol use: Not Currently  . Drug use: Not Currently  . Sexual activity: Not on file  Other Topics Concern  . Not on file  Social History Narrative  . Not on file   Social Determinants of Health   Financial Resource Strain:   . Difficulty of Paying Living Expenses: Not on file  Food Insecurity:   . Worried About Programme researcher, broadcasting/film/video in the Last Year: Not on file  . Ran Out of Food in the Last Year: Not on file  Transportation Needs:   . Lack of Transportation (Medical): Not on file  . Lack of Transportation (Non-Medical): Not on file  Physical Activity:   . Days of Exercise per Week: Not on file  . Minutes of Exercise per Session: Not on file  Stress:   . Feeling of Stress : Not on file  Social Connections:   . Frequency of Communication with Friends and Family: Not on file  . Frequency of Social Gatherings with Friends and Family: Not on file  . Attends Religious Services: Not on file  . Active Member of Clubs or Organizations: Not on file  . Attends Banker Meetings: Not on file  . Marital Status: Not on file  Intimate Partner Violence:   . Fear of Current or Ex-Partner: Not on file  . Emotionally Abused: Not on file  . Physically Abused: Not on file  . Sexually Abused: Not on file    ROS Per hpi   Objective   Vitals as reported by the patient: Today's Vitals   02/21/20 1136  Weight: 292 lb (132.5 kg)  Height: 5\' 4"  (1.626 m)    Jasmine Wu was seen today for transitions of care.  Diagnoses and all orders for this visit:  Dependent edema  Encounter to establish care   PLAN  Discussed  That based on history this sounds like dependent edema likely related to PVD. Will have her come into the office for further work up  Have only had two bp readings available - one has been elevated, the other wnl. Will want to recheck and do cv exam at that time  Discussed watching diet,  particularly salt intake, and using compression stockings as well as elevating legs to improve swelling  Return for in person visit in 2 weeks, ideally in afternoon when swelling is present.  Patient encouraged to call clinic with any questions, comments, or concerns.  I discussed the assessment and treatment plan with the patient. The patient was provided an opportunity to ask questions and all were answered. The patient agreed with the plan and demonstrated an understanding of the instructions.   The patient was advised to call back or seek an in-person evaluation if the symptoms worsen or if the condition fails to improve as anticipated.  I provided 18 minutes of non-face-to-face time during this encounter.  , NP  Primary Care at [facility]

## 2020-02-22 NOTE — Progress Notes (Signed)
Spoke with pt and scheduled appt °

## 2020-03-04 ENCOUNTER — Other Ambulatory Visit: Payer: Self-pay | Admitting: Registered Nurse

## 2020-03-04 DIAGNOSIS — F4323 Adjustment disorder with mixed anxiety and depressed mood: Secondary | ICD-10-CM

## 2020-03-06 ENCOUNTER — Ambulatory Visit: Payer: 59 | Admitting: Registered Nurse

## 2020-03-09 ENCOUNTER — Ambulatory Visit: Payer: 59 | Admitting: Registered Nurse

## 2020-03-09 ENCOUNTER — Encounter: Payer: Self-pay | Admitting: Registered Nurse

## 2020-03-09 ENCOUNTER — Ambulatory Visit (INDEPENDENT_AMBULATORY_CARE_PROVIDER_SITE_OTHER): Payer: BC Managed Care – PPO | Admitting: Registered Nurse

## 2020-03-09 ENCOUNTER — Other Ambulatory Visit: Payer: Self-pay

## 2020-03-09 VITALS — BP 120/82 | HR 100 | Temp 98.0°F | Resp 18 | Ht 64.0 in | Wt 305.0 lb

## 2020-03-09 DIAGNOSIS — Z23 Encounter for immunization: Secondary | ICD-10-CM

## 2020-03-09 DIAGNOSIS — R002 Palpitations: Secondary | ICD-10-CM | POA: Diagnosis not present

## 2020-03-09 DIAGNOSIS — R42 Dizziness and giddiness: Secondary | ICD-10-CM

## 2020-03-09 DIAGNOSIS — R202 Paresthesia of skin: Secondary | ICD-10-CM

## 2020-03-09 NOTE — Patient Instructions (Signed)
° ° ° °  If you have lab work done today you will be contacted with your lab results within the next 2 weeks.  If you have not heard from us then please contact us. The fastest way to get your results is to register for My Chart. ° ° °IF you received an x-ray today, you will receive an invoice from [facility]. Please contact [facility] at [phone] with questions or concerns regarding your invoice.  ° °IF you received labwork today, you will receive an invoice from [facility]. Please contact [facility] at [phone] with questions or concerns regarding your invoice.  ° °Our billing staff will not be able to assist you with questions regarding bills from these companies. ° °You will be contacted with the lab results as soon as they are available. The fastest way to get your results is to activate your My Chart account. Instructions are located on the last page of this paperwork. If you have not heard from us regarding the results in 2 weeks, please contact this office. °  ° ° ° °

## 2020-03-10 ENCOUNTER — Encounter: Payer: Self-pay | Admitting: Registered Nurse

## 2020-03-10 LAB — BASIC METABOLIC PANEL
BUN/Creatinine Ratio: 21 (ref 9–23)
BUN: 15 mg/dL (ref 6–24)
CO2: 28 mmol/L (ref 20–29)
Calcium: 9.4 mg/dL (ref 8.7–10.2)
Chloride: 98 mmol/L (ref 96–106)
Creatinine, Ser: 0.7 mg/dL (ref 0.57–1.00)
GFR calc Af Amer: 116 mL/min/{1.73_m2} (ref >59)
GFR calc non Af Amer: 101 mL/min/{1.73_m2} (ref >59)
Glucose: 94 mg/dL (ref 65–99)
Potassium: 3.8 mmol/L (ref 3.5–5.2)
Sodium: 139 mmol/L (ref 134–144)

## 2020-03-10 LAB — VITAMIN B12: Vitamin B-12: 410 pg/mL (ref 232–1245)

## 2020-03-10 LAB — VITAMIN D 25 HYDROXY (VIT D DEFICIENCY, FRACTURES): Vit D, 25-Hydroxy: 8.4 ng/mL — ABNORMAL LOW (ref 30.0–100.0)

## 2020-03-10 LAB — MAGNESIUM: Magnesium: 1.9 mg/dL (ref 1.6–2.3)

## 2020-03-12 ENCOUNTER — Other Ambulatory Visit: Payer: Self-pay | Admitting: Registered Nurse

## 2020-03-12 DIAGNOSIS — E559 Vitamin D deficiency, unspecified: Secondary | ICD-10-CM

## 2020-03-12 MED ORDER — VITAMIN D (ERGOCALCIFEROL) 1.25 MG (50000 UNIT) PO CAPS: 50000.0000 [IU] | ORAL_CAPSULE | ORAL | 0 refills | Status: DC

## 2020-03-12 NOTE — Progress Notes (Signed)
Vitamin D at 8.4  Will supplement with 50,000 units weekly for 12 weeks  Recheck at that time  Jari Sportsman, NP

## 2020-03-12 NOTE — Telephone Encounter (Signed)
Pt concerned about vit D levels and well as how to increase them pt complains of tiredness and aching in her bones over past few months

## 2020-03-13 ENCOUNTER — Ambulatory Visit: Payer: 59 | Admitting: Registered Nurse

## 2020-04-01 ENCOUNTER — Other Ambulatory Visit: Payer: Self-pay | Admitting: Registered Nurse

## 2020-04-01 DIAGNOSIS — F4323 Adjustment disorder with mixed anxiety and depressed mood: Secondary | ICD-10-CM

## 2020-05-07 ENCOUNTER — Encounter: Payer: Self-pay | Admitting: Registered Nurse

## 2020-05-07 NOTE — Progress Notes (Signed)
Established Patient Office Visit  Subjective:  Patient ID: Jasmine Wu, female    DOB: 16-Dec-1970  Age: 49 y.o. MRN: [id number]  CC:  Chief Complaint  Patient presents with  . Follow-up    Patient states she is here for a 2 week follow up for Swelling. Per patient she would like to discuss becoming lightheaded this week and numbness in left arm for about a week.    HPI Jasmine Wu presents for 2 week follow up for swelling No substantial improvement Is having some lightheadedness, some numbness in fingers L>R No cardiac history of note No chest pain, doe, severe headache, or otherwise. bp wnl today.  Past Medical History:  Diagnosis Date  . Allergy   . Lumbar radiculopathy, chronic 05/05/2019  . Spinal stenosis, lumbar region, with neurogenic claudication 04/08/2019    Past Surgical History:  Procedure Laterality Date  . ABDOMINAL HYSTERECTOMY  2019   one ovary left   . CESAREAN SECTION  2000  . CESAREAN SECTION  2003  . HAND SURGERY     capal tunnell  2012  right    Family History  Problem Relation Age of Onset  . Diabetes Father   . Heart disease Father   . Hypertension Father   . AAA (abdominal aortic aneurysm) Sister     Social History   Socioeconomic History  . Marital status: Married    Spouse name: Not on file  . Number of children: 4  . Years of education: Not on file  . Highest education level: Not on file  Occupational History  . Not on file  Tobacco Use  . Smoking status: Never Smoker  . Smokeless tobacco: Never Used  Vaping Use  . Vaping Use: Never used  Substance and Sexual Activity  . Alcohol use: Not Currently  . Drug use: Not Currently  . Sexual activity: Not on file  Other Topics Concern  . Not on file  Social History Narrative  . Not on file   Social Determinants of Health   Financial Resource Strain: Not on file  Food Insecurity: Not on file  Transportation Needs: Not on file  Physical Activity: Not on file   Stress: Not on file  Social Connections: Not on file  Intimate Partner Violence: Not on file    Outpatient Medications Prior to Visit  Medication Sig Dispense Refill  . losartan-hydrochlorothiazide (HYZAAR) 100-25 MG tablet Take 1 tablet by mouth daily. 90 tablet 3  . pantoprazole (PROTONIX) 40 MG tablet Take 1 tablet (40 mg total) by mouth daily. 90 tablet 3  . PARoxetine (PAXIL) 30 MG tablet TAKE 1 TABLET BY MOUTH EVERY DAY 90 tablet 2  . Polyethylene Glycol 3350 (MIRALAX PO) Take by mouth.    . citalopram (CELEXA) 40 MG tablet Take 1 tablet (40 mg total) by mouth daily. 30 tablet 56  . methocarbamol (ROBAXIN) 500 MG tablet Take 1 tablet (500 mg total) by mouth every 6 (six) hours as needed for muscle spasms. 30 tablet 0  . methylPREDNISolone (MEDROL DOSEPAK) 4 MG TBPK tablet As directed on pkg label 21 each 0  . Naproxen Sodium (ALEVE PO) Take by mouth.    . QUEtiapine (SEROQUEL) 25 MG tablet Take 1 tablet (25 mg total) by mouth at bedtime. 30 tablet 3   No facility-administered medications prior to visit.    No Known Allergies  ROS Review of Systems  Constitutional: Negative.   HENT: Negative.   Eyes: Negative.   Respiratory: Negative.  Cardiovascular: Negative.   Gastrointestinal: Negative.   Genitourinary: Negative.   Musculoskeletal: Negative.   Skin: Negative.   Neurological: Negative.   Psychiatric/Behavioral: Negative.   All other systems reviewed and are negative.     Objective:    Physical Exam Vitals and nursing note reviewed.  Constitutional:      General: She is not in acute distress.    Appearance: Normal appearance. She is normal weight. She is not ill-appearing, toxic-appearing or diaphoretic.  Cardiovascular:     Rate and Rhythm: Normal rate and regular rhythm.     Heart sounds: Normal heart sounds. No murmur heard. No friction rub. No gallop.   Pulmonary:     Effort: Pulmonary effort is normal. No respiratory distress.     Breath sounds:  Normal breath sounds. No stridor. No wheezing, rhonchi or rales.  Chest:     Chest wall: No tenderness.  Musculoskeletal:        General: Swelling present. No tenderness, deformity or signs of injury. Normal range of motion.     Right lower leg: Edema present.     Left lower leg: Edema present.  Skin:    General: Skin is warm and dry.     Capillary Refill: Capillary refill takes less than 2 seconds.  Neurological:     General: No focal deficit present.     Mental Status: She is alert and oriented to person, place, and time. Mental status is at baseline.     Cranial Nerves: No cranial nerve deficit.     Sensory: No sensory deficit.     Motor: No weakness.     Coordination: Coordination normal.     Gait: Gait normal.  Psychiatric:        Mood and Affect: Mood normal.        Behavior: Behavior normal.        Thought Content: Thought content normal.        Judgment: Judgment normal.     BP 120/82   Pulse 100   Temp 98 F (36.7 C) (Temporal)   Resp 18   Ht 5\' 4"  (1.626 m)   Wt (!) 305 lb (138.3 kg)   SpO2 97%   BMI 52.35 kg/m  Wt Readings from Last 3 Encounters:  03/09/20 (!) 305 lb (138.3 kg)  02/21/20 292 lb (132.5 kg)  09/13/19 300 lb (136.1 kg)     Health Maintenance Due  Topic Date Due  . COVID-19 Vaccine (1) Never done  . PAP SMEAR-Modifier  Never done    There are no preventive care reminders to display for this patient.  Lab Results  Component Value Date   TSH 1.350 12/21/2019   Lab Results  Component Value Date   WBC 9.6 12/21/2019   HGB 12.7 12/21/2019   HCT 39.5 12/21/2019   MCV 84 12/21/2019   PLT 383 12/21/2019   Lab Results  Component Value Date   NA 139 03/09/2020   K 3.8 03/09/2020   CO2 28 03/09/2020   GLUCOSE 94 03/09/2020   BUN 15 03/09/2020   CREATININE 0.70 03/09/2020   BILITOT 0.3 12/21/2019   ALKPHOS 77 12/21/2019   AST 18 12/21/2019   ALT 34 (H) 12/21/2019   PROT 6.7 12/21/2019   ALBUMIN 4.1 12/21/2019   CALCIUM 9.4  03/09/2020   Lab Results  Component Value Date   CHOL 164 12/21/2019   Lab Results  Component Value Date   HDL 33 (L) 12/21/2019   Lab Results  Component  Value Date   LDLCALC 112 (H) 12/21/2019   Lab Results  Component Value Date   TRIG 100 12/21/2019   Lab Results  Component Value Date   CHOLHDL 5.0 (H) 12/21/2019   Lab Results  Component Value Date   HGBA1C 6.1 (H) 04/18/2019      Assessment & Plan:   Problem List Items Addressed This Visit   None   Visit Diagnoses    Palpitations    -  Primary   Relevant Orders   EKG 12-Lead (Completed)   Vitamin D, 25-hydroxy (Completed)   Vitamin B12 (Completed)   Magnesium (Completed)   Basic Metabolic Panel (Completed)   Lightheadedness       Relevant Orders   EKG 12-Lead (Completed)   Paresthesia       Relevant Orders   Vitamin D, 25-hydroxy (Completed)   Vitamin B12 (Completed)   Basic Metabolic Panel (Completed)   Flu vaccine need       Relevant Orders   Flu Vaccine QUAD 6+ mos PF IM (Fluarix Quad PF) (Completed)      No orders of the defined types were placed in this encounter.   Follow-up: No follow-ups on file.   PLAN  EKG done today. No comparison available. Rsr v1 nondiagnostic - feel this is likely related to low precordial voltage rather than pathology.   Labs ordered - suspect vitamin deficiency contributing to symptoms. Will have close follow up with low threshold for cardiology referral  Flu shot given  Patient encouraged to call clinic with any questions, comments, or concerns.  Janeece Agee, NP

## 2020-05-10 ENCOUNTER — Telehealth (INDEPENDENT_AMBULATORY_CARE_PROVIDER_SITE_OTHER): Payer: BC Managed Care – PPO | Admitting: Family Medicine

## 2020-05-10 ENCOUNTER — Telehealth: Payer: Self-pay | Admitting: Registered Nurse

## 2020-05-10 ENCOUNTER — Encounter: Payer: Self-pay | Admitting: Family Medicine

## 2020-05-10 DIAGNOSIS — Z1231 Encounter for screening mammogram for malignant neoplasm of breast: Secondary | ICD-10-CM

## 2020-05-10 DIAGNOSIS — H109 Unspecified conjunctivitis: Secondary | ICD-10-CM | POA: Diagnosis not present

## 2020-05-10 MED ORDER — POLYMYXIN B-TRIMETHOPRIM 10000-0.1 UNIT/ML-% OP SOLN: 1.0000 [drp] | OPHTHALMIC | 0 refills | Status: AC

## 2020-05-10 NOTE — Progress Notes (Addendum)
Virtual Visit Note  I connected with patient on 05/10/20 at 1505 by telephone due to unable to work Epic video visit and verified that I am speaking with the correct person using two identifiers. Ren Grasse is currently located at home and no family members are currently with them during visit. The provider, Azalee Course Galina Haddox, FNP is located in their office at time of visit.  I discussed the limitations, risks, security and privacy concerns of performing an evaluation and management service by telephone and the availability of in person appointments. I also discussed with the patient that there may be a patient responsible charge related to this service. The patient expressed understanding and agreed to proceed.   I provided 20 minutes of non-face-to-face time during this encounter.  Chief Complaint  Patient presents with  . possible pink eye     X 2 days - has yellow discharge , used visine  . Nasal Congestion    Sinus pain , Cheek pain , headache , negative covid - home test      HPI  Symptoms started 2 days ago Sore eyes in the morning Red eyes in the morning Kids have had pink eye in the past Matted eyelashes in the morning Yellow crusting in the morning Denies issues with allergies Has been using eye drops clears redness but not the drainage Mostly only drainage in the morning Denies systemic symptoms   No Known Allergies  Prior to Admission medications   Medication Sig Start Date End Date Taking? Authorizing Provider  losartan-hydrochlorothiazide (HYZAAR) 100-25 MG tablet Take 1 tablet by mouth daily. 07/19/19  Yes Royal Hawthorn, NP  pantoprazole (PROTONIX) 40 MG tablet Take 1 tablet (40 mg total) by mouth daily. 01/27/20  Yes Janeece Agee, NP  PARoxetine (PAXIL) 30 MG tablet TAKE 1 TABLET BY MOUTH EVERY DAY 01/02/20  Yes Janeece Agee, NP  Polyethylene Glycol 3350 (MIRALAX PO) Take by mouth.   Yes [provider]  Vitamin D, Ergocalciferol,  (DRISDOL) 1.25 MG (50000 UNIT) CAPS capsule Take 1 capsule (50,000 Units total) by mouth every 7 (seven) days. 03/12/20  Yes Janeece Agee, NP    Past Medical History:  Diagnosis Date  . Allergy   . Lumbar radiculopathy, chronic 05/05/2019  . Spinal stenosis, lumbar region, with neurogenic claudication 04/08/2019    Past Surgical History:  Procedure Laterality Date  . ABDOMINAL HYSTERECTOMY  2019   one ovary left   . CESAREAN SECTION  2000  . CESAREAN SECTION  2003  . HAND SURGERY     capal tunnell  2012  right    Social History   Tobacco Use  . Smoking status: Never Smoker  . Smokeless tobacco: Never Used  Substance Use Topics  . Alcohol use: Not Currently    Family History  Problem Relation Age of Onset  . Diabetes Father   . Heart disease Father   . Hypertension Father   . AAA (abdominal aortic aneurysm) Sister     Review of Systems  Constitutional: Negative for chills, fever and malaise/fatigue.  HENT: Positive for sinus pain. Negative for congestion and sore throat.   Eyes: Positive for pain, discharge and redness. Negative for blurred vision, double vision and photophobia.  Respiratory: Negative for cough, sputum production, shortness of breath and wheezing.   Cardiovascular: Negative for chest pain.  Neurological: Positive for headaches.    Objective  Constitutional:      General: She is not in acute distress.    Appearance: Normal  appearance. She is not ill-appearing.   Pulmonary:     Effort: Pulmonary effort is normal. No respiratory distress.  Neurological:     Mental Status: She is alert and oriented to person, place, and time.  Psychiatric:        Mood and Affect: Mood normal.        Behavior: Behavior normal.     ASSESSMENT and PLAN  Problem List Items Addressed This Visit   None   Visit Diagnoses    Bacterial conjunctivitis    -  Primary   Relevant Medications   trimethoprim-polymyxin b (POLYTRIM) ophthalmic solution  RTC/ ED  precautions provided Discussed r/se/b of medications Educated on contagious nature of this infection Discussed throwing away makeup and no contacts Conservative treatment:  Wipe any fluid from your eye with a warm, wet washcloth or a cotton ball.  Place a clean, cool, wet cloth on your eye. Do this for 10-20 minutes, 3-4 times per day        Return if symptoms worsen or fail to improve, for next scheduled visit.    The above assessment and management plan was discussed with the patient. The patient verbalized understanding of and has agreed to the management plan. Patient is aware to call the clinic if symptoms persist or worsen. Patient is aware when to return to the clinic for a follow-up visit. Patient educated on when it is appropriate to go to the emergency department.     Macario Carls Loana Salvaggio, FNP-BC Primary Care at [facility] [address] [place], [place] [address] Ph.  [phone] Fax [phone]  I have reviewed and agree with above documentation. Edwina Barth, MD

## 2020-05-10 NOTE — Telephone Encounter (Signed)
Pt would like a referral sent in for her  because she is due for routine mammogram and just found a lump.  [facility]  imaging   .  Patient with her insurance and they do cover a 3D image if she needs that    Please advise

## 2020-05-10 NOTE — Patient Instructions (Addendum)
Bacterial Conjunctivitis, Adult Bacterial conjunctivitis is an infection of your conjunctiva. This is the clear membrane that covers the white part of your eye and the inner part of your eyelid. This infection can make your eye:  Red or pink.  Itchy. This condition spreads easily from person to person (is contagious) and from one eye to the other eye. What are the causes?  This condition is caused by germs (bacteria). You may get the infection if you come into close contact with: ? A person who has the infection. ? Items that have germs on them (are contaminated), such as face towels, contact lens solution, or eye makeup. What increases the risk? You are more likely to get this condition if you:  Have contact with people who have the infection.  Wear contact lenses.  Have a sinus infection.  Have had a recent eye injury or surgery.  Have a weak body defense system (immune system).  Have dry eyes. What are the signs or symptoms?   Thick, yellowish discharge from the eye.  Tearing or watery eyes.  Itchy eyes.  Burning feeling in your eyes.  Eye redness.  Swollen eyelids.  Blurred vision. How is this treated?   Antibiotic eye drops or ointment.  Antibiotic medicine taken by mouth. This is used for infections that do not get better with drops or ointment or that last more than 10 days.  Cool, wet cloths placed on the eyes.  Artificial tears used 2-6 times a day. Follow these instructions at home: Medicines  Take or apply your antibiotic medicine as told by your doctor. Do not stop taking or applying the antibiotic even if you start to feel better.  Take or apply over-the-counter and prescription medicines only as told by your doctor.  Do not touch your eyelid with the eye-drop bottle or the ointment tube. Managing discomfort  Wipe any fluid from your eye with a warm, wet washcloth or a cotton ball.  Place a clean, cool, wet cloth on your eye. Do  this for 10-20 minutes, 3-4 times per day. General instructions  Do not wear contacts until the infection is gone. Wear glasses until your doctor says it is okay to wear contacts again.  Do not wear eye makeup until the infection is gone. Throw away old eye makeup.  Change or wash your pillowcase every day.  Do not share towels or washcloths.  Wash your hands often with soap and water. Use paper towels to dry your hands.  Do not touch or rub your eyes.  Do not drive or use heavy machinery if your vision is blurred. Contact a doctor if:  You have a fever.  You do not get better after 10 days. Get help right away if:  You have a fever and your symptoms get worse all of a sudden.  You have very bad pain when you move your eye.  Your face: ? Hurts. ? Is red. ? Is swollen.  You have sudden loss of vision. Summary  Bacterial conjunctivitis is an infection of your conjunctiva.  This infection spreads easily from person to person.  Wash your hands often with soap and water. Use paper towels to dry your hands.  Take or apply your antibiotic medicine as told by your doctor.  Contact a doctor if you have a fever or you do not get better after 10 days. This information is not intended to replace advice given to you by your health care provider.  Make sure you discuss any questions you have with your health care provider. Document Revised: 08/31/2018 Document Reviewed: 12/16/2017 Elsevier Patient Education  [employer].  If you have lab work done today you will be contacted with your lab results within the next 2 weeks.  If you have not heard from [place] then please contact us. The fastest way to get your results is to register for My Chart.   IF you received an x-ray today, you will receive an invoice from [facility]. Please contact [facility] at [phone] with questions or concerns regarding your invoice.   IF you received labwork today, you will  receive an invoice from [facility]. Please contact [facility] at [phone] with questions or concerns regarding your invoice.   Our billing staff will not be able to assist you with questions regarding bills from these companies.  You will be contacted with the lab results as soon as they are available. The fastest way to get your results is to activate your My Chart account. Instructions are located on the last page of this paperwork. If you have not heard from [place] regarding the results in 2 weeks, please contact this office.

## 2020-05-14 NOTE — Telephone Encounter (Signed)
Ordered and routed.

## 2020-05-18 DIAGNOSIS — Z20822 Contact with and (suspected) exposure to covid-19: Secondary | ICD-10-CM | POA: Diagnosis not present

## 2020-05-29 DIAGNOSIS — Z1231 Encounter for screening mammogram for malignant neoplasm of breast: Secondary | ICD-10-CM

## 2020-06-12 ENCOUNTER — Other Ambulatory Visit: Payer: Self-pay | Admitting: Registered Nurse

## 2020-06-12 MED ORDER — LOSARTAN POTASSIUM-HCTZ 100-25 MG PO TABS: 1.0000 | ORAL_TABLET | Freq: Every day | ORAL | 0 refills | Status: DC

## 2020-06-12 NOTE — Telephone Encounter (Signed)
Please see previous encounter regarding prescription.

## 2020-06-12 NOTE — Telephone Encounter (Signed)
Requested medication (s) are due for refill today: yes  Requested medication (s) are on the active medication list: yes  Future visit scheduled: yes  Notes to clinic:  Please review for refill. Pharmacy advised pt this Rx has been on back order for 2 months and they need an alternate medication for the pt/ please advise     Requested Prescriptions  Pending Prescriptions Disp Refills   losartan-hydrochlorothiazide (HYZAAR) 100-25 MG tablet 90 tablet 3    Sig: Take 1 tablet by mouth daily.      Cardiovascular: ARB + Diuretic Combos Passed - 06/12/2020 12:40 PM      Passed - K in normal range and within 180 days    Potassium  Date Value Ref Range Status  03/09/2020 3.8 3.5 - 5.2 mmol/L Final          Passed - Na in normal range and within 180 days    Sodium  Date Value Ref Range Status  03/09/2020 139 134 - 144 mmol/L Final          Passed - Cr in normal range and within 180 days    Creatinine, Ser  Date Value Ref Range Status  03/09/2020 0.70 0.57 - 1.00 mg/dL Final          Passed - Ca in normal range and within 180 days    Calcium  Date Value Ref Range Status  03/09/2020 9.4 8.7 - 10.2 mg/dL Final          Passed - Patient is not pregnant      Passed - Last BP in normal range    BP Readings from Last 1 Encounters:  03/09/20 120/82          Passed - Valid encounter within last 6 months    Recent Outpatient Visits           1 month ago Bacterial conjunctivitis   [facility], Azalee Course, FNP   3 months ago Palpitations   [facility], Gerlene Burdock, NP   3 months ago Dependent edema   [facility] at [facility], Gerlene Burdock, NP   9 months ago Dermatitis   Primary Care at [facility], Lonna Cobb, NP   10 months ago Essential hypertension   Primary Care at [facility], Lonna Cobb, NP       Future Appointments             In 1 week Janeece Agee, NP Primary Care at [place], [facility]

## 2020-06-12 NOTE — Telephone Encounter (Signed)
Medication Refill - Medication: losartan-hydrochlorothiazide (HYZAAR) 100-25 MG tablet  Pharmacy advised pt this Rx has been on back order for 2 months and they need an alternate medication for the pt/ please advise   Has the patient contacted their pharmacy? Yes.   (Agent: If no, request that the patient contact the pharmacy for the refill.) (Agent: If yes, when and what did the pharmacy advise?)ask pcp to send alternate med due to back order   Preferred Pharmacy (with phone number or street name): [facility] [phone] [place], [place] - [address]  [address], [place] [address]  Phone:  [phone] Fax:  [phone]   Agent: Please be advised that RX refills may take up to 3 business days. We ask that you follow-up with your pharmacy.

## 2020-06-14 ENCOUNTER — Encounter: Payer: Self-pay | Admitting: Registered Nurse

## 2020-06-14 NOTE — Telephone Encounter (Signed)
Pt wants mammo typically we ask for ov to make referral but given our short appointment supply do you want to order with out one? Please advise

## 2020-06-19 ENCOUNTER — Ambulatory Visit: Payer: BC Managed Care – PPO | Admitting: Registered Nurse

## 2020-06-22 ENCOUNTER — Ambulatory Visit: Payer: BC Managed Care – PPO | Admitting: Registered Nurse

## 2020-06-26 ENCOUNTER — Ambulatory Visit: Payer: BC Managed Care – PPO | Admitting: Registered Nurse

## 2020-07-05 ENCOUNTER — Telehealth: Payer: Self-pay | Admitting: Registered Nurse

## 2020-07-05 NOTE — Telephone Encounter (Signed)
Patient is calling looking for referral that should have been sent to [facility] mammography / this would have been diagnostic with ultrasound . Patient states she has called several times   Please reach put to patient with status of this request

## 2020-07-06 ENCOUNTER — Other Ambulatory Visit: Payer: Self-pay

## 2020-07-06 NOTE — Telephone Encounter (Signed)
Pt states she needs a diagnostic mammo with [place]? Please advise

## 2020-07-10 ENCOUNTER — Encounter: Payer: Self-pay | Admitting: Registered Nurse

## 2020-07-10 DIAGNOSIS — R928 Other abnormal and inconclusive findings on diagnostic imaging of breast: Secondary | ICD-10-CM | POA: Diagnosis not present

## 2020-07-10 DIAGNOSIS — N6001 Solitary cyst of right breast: Secondary | ICD-10-CM | POA: Diagnosis not present

## 2020-07-11 NOTE — Telephone Encounter (Signed)
Completed -   If we could call patient to ensure she has received the benign results - a simple cyst.  Thank you  Rich

## 2020-07-12 NOTE — Telephone Encounter (Signed)
Pt aware.

## 2020-07-17 ENCOUNTER — Ambulatory Visit: Payer: BC Managed Care – PPO | Admitting: Registered Nurse

## 2020-07-24 ENCOUNTER — Ambulatory Visit: Payer: BC Managed Care – PPO | Admitting: Registered Nurse

## 2020-08-01 ENCOUNTER — Encounter: Payer: Self-pay | Admitting: Registered Nurse

## 2020-08-01 ENCOUNTER — Ambulatory Visit (INDEPENDENT_AMBULATORY_CARE_PROVIDER_SITE_OTHER): Payer: BC Managed Care – PPO | Admitting: Registered Nurse

## 2020-08-01 ENCOUNTER — Other Ambulatory Visit: Payer: Self-pay

## 2020-08-01 VITALS — BP 140/83 | HR 95 | Temp 98.0°F | Resp 18 | Ht 64.0 in | Wt 308.8 lb

## 2020-08-01 DIAGNOSIS — G5602 Carpal tunnel syndrome, left upper limb: Secondary | ICD-10-CM

## 2020-08-01 DIAGNOSIS — E559 Vitamin D deficiency, unspecified: Secondary | ICD-10-CM | POA: Diagnosis not present

## 2020-08-01 DIAGNOSIS — M7712 Lateral epicondylitis, left elbow: Secondary | ICD-10-CM | POA: Diagnosis not present

## 2020-08-01 DIAGNOSIS — R200 Anesthesia of skin: Secondary | ICD-10-CM | POA: Diagnosis not present

## 2020-08-01 MED ORDER — VITAMIN D (ERGOCALCIFEROL) 1.25 MG (50000 UNIT) PO CAPS: 50000.0000 [IU] | ORAL_CAPSULE | ORAL | 0 refills | Status: DC

## 2020-08-01 MED ORDER — DICLOFENAC SODIUM 1 % EX GEL: 2.0000 g | Freq: Four times a day (QID) | CUTANEOUS | 3 refills | Status: DC

## 2020-08-01 NOTE — Patient Instructions (Signed)
° ° ° °  If you have lab work done today you will be contacted with your lab results within the next 2 weeks.  If you have not heard from us then please contact us. The fastest way to get your results is to register for My Chart. ° ° °IF you received an x-ray today, you will receive an invoice from [facility]. Please contact [facility] at [phone] with questions or concerns regarding your invoice.  ° °IF you received labwork today, you will receive an invoice from [facility]. Please contact [facility] at [phone] with questions or concerns regarding your invoice.  ° °Our billing staff will not be able to assist you with questions regarding bills from these companies. ° °You will be contacted with the lab results as soon as they are available. The fastest way to get your results is to activate your My Chart account. Instructions are located on the last page of this paperwork. If you have not heard from us regarding the results in 2 weeks, please contact this office. °  ° ° ° °

## 2020-08-01 NOTE — Progress Notes (Signed)
Established Patient Office Visit  Subjective:  Patient ID: Jasmine Wu, female    DOB: [birth year]  Age: 50 y.o. MRN: [id number]  CC:  Chief Complaint  Patient presents with  . Follow-up    Patient states she is here for a Vitamin D recheck and also to discuss numbness in her left hand for about 6 months     HPI Jasmine Wu presents for vitamin d recheck and ongoing numbness in L hand  Feeling more and more similar to carpal tunnel in R hand that she has had in remote past. 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 No hx of neck injury, mva, etc.  Switched to paxil with mental health provider. Going well. Good effect. No AEs.  Past Medical History:  Diagnosis Date  . Allergy   . Lumbar radiculopathy, chronic 05/05/2019  . Spinal stenosis, lumbar region, with neurogenic claudication 04/08/2019    Past Surgical History:  Procedure Laterality Date  . ABDOMINAL HYSTERECTOMY  2019   one ovary left   . CESAREAN SECTION  2000  . CESAREAN SECTION  2003  . HAND SURGERY     capal tunnell  2012  right    Family History  Problem Relation Age of Onset  . Diabetes Father   . Heart disease Father   . Hypertension Father   . AAA (abdominal aortic aneurysm) Sister     Social History   Socioeconomic History  . Marital status: Married    Spouse name: Not on file  . Number of children: 4  . Years of education: Not on file  . Highest education level: Not on file  Occupational History  . Not on file  Tobacco Use  . Smoking status: Never Smoker  . Smokeless tobacco: Never Used  Vaping Use  . Vaping Use: Never used  Substance and Sexual Activity  . Alcohol use: Not Currently  . Drug use: Not Currently  . Sexual activity: Not on file   Other Topics Concern  . Not on file  Social History Narrative  . Not on file   Social Determinants of Health   Financial Resource Strain: Not on file  Food Insecurity: Not on file  Transportation Needs: Not on file  Physical Activity: Not on file  Stress: Not on file  Social Connections: Not on file  Intimate Partner Violence: Not on file    Outpatient Medications Prior to Visit  Medication Sig Dispense Refill  . losartan-hydrochlorothiazide (HYZAAR) 100-25 MG tablet Take 1 tablet by mouth daily. 90 tablet 0  . pantoprazole (PROTONIX) 40 MG tablet Take 1 tablet (40 mg total) by mouth daily. 90 tablet 3  . PARoxetine (PAXIL) 30 MG tablet TAKE 1 TABLET BY MOUTH EVERY DAY 90 tablet 2  . Polyethylene Glycol 3350 (MIRALAX PO) Take by mouth.    . Vitamin D, Ergocalciferol, (DRISDOL) 1.25 MG (50000 UNIT) CAPS capsule Take 1 capsule (50,000 Units total) by mouth every 7 (seven) days. 12 capsule 0   No facility-administered medications prior to visit.    No Known Allergies  ROS Review of Systems  Constitutional: Negative.   HENT: Negative.   Eyes: Negative.   Respiratory: Negative.   Cardiovascular: Negative.   Gastrointestinal: Negative.   Genitourinary: Negative.  Musculoskeletal: Positive for arthralgias.  Skin: Negative.   Neurological: Positive for numbness.  Psychiatric/Behavioral: Negative.   All other systems reviewed and are negative.     Objective:    Physical Exam Vitals and nursing note reviewed.  Constitutional:      General: She is not in acute distress.    Appearance: Normal appearance. She is normal weight. She is not ill-appearing, toxic-appearing or diaphoretic.  Cardiovascular:     Rate and Rhythm: Normal rate and regular rhythm.     Heart sounds: Normal heart sounds. No murmur heard. No friction rub. No gallop.   Pulmonary:     Effort: Pulmonary effort is normal. No respiratory distress.     Breath sounds: Normal breath sounds. No stridor. No  wheezing, rhonchi or rales.  Chest:     Chest wall: No tenderness.  Musculoskeletal:        General: Tenderness (lateral epicondyle) present.     Comments: Positive Tinel L wrist  Skin:    General: Skin is warm and dry.  Neurological:     General: No focal deficit present.     Mental Status: She is alert and oriented to person, place, and time. Mental status is at baseline.  Psychiatric:        Mood and Affect: Mood normal.        Behavior: Behavior normal.        Thought Content: Thought content normal.        Judgment: Judgment normal.     BP 140/83   Pulse 95   Temp 98 F (36.7 C) (Temporal)   Resp 18   Ht 5\' 4"  (1.626 m)   Wt (!) 308 lb 12.8 oz (140.1 kg)   SpO2 95%   BMI 53.01 kg/m  Wt Readings from Last 3 Encounters:  08/01/20 (!) 308 lb 12.8 oz (140.1 kg)  03/09/20 (!) 305 lb (138.3 kg)  02/21/20 292 lb (132.5 kg)     There are no preventive care reminders to display for this patient.  There are no preventive care reminders to display for this patient.  Lab Results  Component Value Date   TSH 1.350 12/21/2019   Lab Results  Component Value Date   WBC 9.6 12/21/2019   HGB 12.7 12/21/2019   HCT 39.5 12/21/2019   MCV 84 12/21/2019   PLT 383 12/21/2019   Lab Results  Component Value Date   NA 139 03/09/2020   K 3.8 03/09/2020   CO2 28 03/09/2020   GLUCOSE 94 03/09/2020   BUN 15 03/09/2020   CREATININE 0.70 03/09/2020   BILITOT 0.3 12/21/2019   ALKPHOS 77 12/21/2019   AST 18 12/21/2019   ALT 34 (H) 12/21/2019   PROT 6.7 12/21/2019   ALBUMIN 4.1 12/21/2019   CALCIUM 9.4 03/09/2020   Lab Results  Component Value Date   CHOL 164 12/21/2019   Lab Results  Component Value Date   HDL 33 (L) 12/21/2019   Lab Results  Component Value Date   LDLCALC 112 (H) 12/21/2019   Lab Results  Component Value Date   TRIG 100 12/21/2019   Lab Results  Component Value Date   CHOLHDL 5.0 (H) 12/21/2019   Lab Results  Component Value Date   HGBA1C  6.1 (H) 04/18/2019      Assessment & Plan:   Problem List Items Addressed This Visit   None   Visit Diagnoses    Vitamin D deficiency    -  Primary   Relevant  Medications   Vitamin D, Ergocalciferol, (DRISDOL) 1.25 MG (50000 UNIT) CAPS capsule   Other Relevant Orders   Vitamin D, 25-hydroxy   Hand numbness       Relevant Orders   DG Cervical Spine Complete   Ambulatory referral to Hand Surgery   Left carpal tunnel syndrome       Relevant Orders   Ambulatory referral to Hand Surgery      Meds ordered this encounter  Medications  . Vitamin D, Ergocalciferol, (DRISDOL) 1.25 MG (50000 UNIT) CAPS capsule    Sig: Take 1 capsule (50,000 Units total) by mouth every 7 (seven) days.    Dispense:  12 capsule    Refill:  0    Order Specific Question:   Supervising Provider    Answer:   Neva Seat, JEFFREY R [[id number]]    Follow-up: No follow-ups on file.   PLAN  Recheck vitamin D  Order dg c spine to check for ddd  Refer to hand specialist  Diclofenac for tennis elbow and carpal tunnel  Patient encouraged to call clinic with any questions, comments, or concerns.  Janeece Agee, NP

## 2020-08-02 LAB — VITAMIN D 25 HYDROXY (VIT D DEFICIENCY, FRACTURES): Vit D, 25-Hydroxy: 13 ng/mL — ABNORMAL LOW (ref 30.0–100.0)

## 2020-08-07 ENCOUNTER — Ambulatory Visit: Payer: BC Managed Care – PPO | Admitting: Physician Assistant

## 2020-08-07 ENCOUNTER — Other Ambulatory Visit: Payer: Self-pay | Admitting: Registered Nurse

## 2020-08-07 ENCOUNTER — Encounter: Payer: Self-pay | Admitting: Physician Assistant

## 2020-08-07 VITALS — BP 136/72 | HR 88 | Temp 98.4°F | Ht 64.0 in | Wt 307.0 lb

## 2020-08-07 DIAGNOSIS — H60392 Other infective otitis externa, left ear: Secondary | ICD-10-CM | POA: Diagnosis not present

## 2020-08-07 DIAGNOSIS — J3089 Other allergic rhinitis: Secondary | ICD-10-CM

## 2020-08-07 MED ORDER — CIPRO HC 0.2-1 % OT SUSP: 3.0000 [drp] | Freq: Two times a day (BID) | OTIC | 0 refills | Status: AC

## 2020-08-07 NOTE — Patient Instructions (Signed)
Otitis Externa  Otitis externa is an infection of the outer ear canal. The outer ear canal is the area between the outside of the ear and the eardrum. Otitis externa is sometimes called swimmer's ear. What are the causes? Common causes of this condition include:  Swimming in dirty water.  Moisture in the ear.  An injury to the inside of the ear.  An object stuck in the ear.  A cut or scrape on the outside of the ear. What increases the risk? You are more likely to develop this condition if you go swimming often. What are the signs or symptoms? The first symptom of this condition is often itching in the ear. Later symptoms of the condition include:  Swelling of the ear.  Redness in the ear.  Ear pain. The pain may get worse when you pull on your ear.  Pus coming from the ear. How is this diagnosed? This condition may be diagnosed by examining the ear and testing fluid from the ear for bacteria and funguses. How is this treated? This condition may be treated with:  Antibiotic ear drops. These are often given for 10-14 days.  Medicines to reduce itching and swelling. Follow these instructions at home:  If you were prescribed antibiotic ear drops, use them as told by your health care provider. Do not stop using the antibiotic even if your condition improves.  Take over-the-counter and prescription medicines only as told by your health care provider.  Avoid getting water in your ears as told by your health care provider. This may include avoiding swimming or water sports for a few days.  Keep all follow-up visits as told by your health care provider. This is important. How is this prevented?  Keep your ears dry. Use the corner of a towel to dry your ears after you swim or bathe.  Avoid scratching or putting things in your ear. Doing these things can damage the ear canal or remove the protective wax that lines it, which makes it easier for bacteria and funguses to  grow.  Avoid swimming in lakes, polluted water, or pools that may not have enough chlorine. Contact a health care provider if:  You have a fever.  Your ear is still red, swollen, painful, or draining pus after 3 days.  Your redness, swelling, or pain gets worse.  You have a severe headache.  You have redness, swelling, pain, or tenderness in the area behind your ear. Summary  Otitis externa is an infection of the outer ear canal.  Common causes include swimming in dirty water, moisture in the ear, or a cut or scrape in the ear.  Symptoms include pain, redness, and swelling of the ear.  If you were prescribed antibiotic ear drops, use them as told by your health care provider. Do not stop using the antibiotic even if your condition improves. This information is not intended to replace advice given to you by your health care provider. Make sure you discuss any questions you have with your health care provider. Document Revised: 10/16/2017 Document Reviewed: 10/16/2017 Elsevier Patient Education  2021 Elsevier Inc.  

## 2020-08-07 NOTE — Progress Notes (Addendum)
Subjective:    Patient ID: Jasmine Wu, female    DOB: Jan 08, 1971, 50 y.o.   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  Constitutional:      Appearance: She is obese.     Comments: Reports discomfort left ear since yesterday  Left wrist discomfort ongoing  BMI 52  HENT:     Head: Normocephalic and atraumatic.     Ears:     Comments: Right ear with non obstructing but present cerumen sidewalls  Left ear tender to manipulation pinna Canal with moisture and cerumen canal, TM cannot be fully visualized. Could not tolerate manipulation  Not wearing earrings- Reports universal metal allergy, not tolerated     Nose: Rhinorrhea present.     Comments: clear    Mouth/Throat:     Mouth: Mucous membranes are moist.  Eyes:     Extraocular Movements: Extraocular movements intact.  Cardiovascular:     Rate and Rhythm: Normal rate.     Comments:  136/72 Pulmonary:     Effort: Pulmonary effort is normal.  Abdominal:     Comments: protuberant  Genitourinary:    Comments: defer Musculoskeletal:     Cervical back: Normal range of motion.     Comments: Ambulates without assistance,  on and off table  Left wrist discomfort elicited with ROM, no swelling noted She reports pain peforming office paperwork manipulations. Denies numbness or tingling presently Uses night brace  Lymphadenopathy:     Cervical: No cervical adenopathy.  Skin:    General: Skin is warm.     Capillary Refill: Capillary refill takes less than 2 seconds.  Neurological:     General: No focal deficit present.     Mental Status: She is alert.  Psychiatric:        Mood and Affect: Mood normal.       Assessment & Plan:  Left ear pain Left Otitis Externa- Rx  Cipro /HC 3 drops BID [facility] n/a clinic My Chart Info  Seasonal Allergies-      Add cetirizine 10 mg 1 QD   Fluticasone nasal spray 1 STEN  BID x 2 days then QD Increase hydration 64 oz water per day Tylenol 2 tabs q 6 h prn pain  Salt restriction, elevate hands- don't dangle- Caution rings- consider remove early day if possible Has consultation with Ortho tomorrow  RTC in 1 week for re-evaluation -contact with increase  concerns , Questions,change in symptom presentation prn  Addnedum:  Rx was not available at [facility] in [place] without a delay to order . Patient anxious to initiate -  I called and spoke to pharmacist- Rx adjusted to Ciprodex suspension 4 qtts BID x 7 days.   LWLee PA-C

## 2020-08-08 ENCOUNTER — Telehealth (INDEPENDENT_AMBULATORY_CARE_PROVIDER_SITE_OTHER): Payer: BC Managed Care – PPO | Admitting: Registered Nurse

## 2020-08-08 ENCOUNTER — Ambulatory Visit: Payer: BC Managed Care – PPO | Admitting: Physician Assistant

## 2020-08-08 ENCOUNTER — Other Ambulatory Visit: Payer: Self-pay

## 2020-08-08 DIAGNOSIS — H66002 Acute suppurative otitis media without spontaneous rupture of ear drum, left ear: Secondary | ICD-10-CM | POA: Diagnosis not present

## 2020-08-08 DIAGNOSIS — J302 Other seasonal allergic rhinitis: Secondary | ICD-10-CM

## 2020-08-08 MED ORDER — AZELASTINE HCL 0.1 % NA SOLN: 1.0000 | Freq: Two times a day (BID) | NASAL | 12 refills | Status: DC

## 2020-08-08 MED ORDER — AMOXICILLIN-POT CLAVULANATE 875-125 MG PO TABS: 1.0000 | ORAL_TABLET | Freq: Two times a day (BID) | ORAL | 0 refills | Status: DC

## 2020-08-08 NOTE — Patient Instructions (Signed)
° ° ° °  If you have lab work done today you will be contacted with your lab results within the next 2 weeks.  If you have not heard from us then please contact us. The fastest way to get your results is to register for My Chart. ° ° °IF you received an x-ray today, you will receive an invoice from [facility]. Please contact [facility] at [phone] with questions or concerns regarding your invoice.  ° °IF you received labwork today, you will receive an invoice from [facility]. Please contact [facility] at [phone] with questions or concerns regarding your invoice.  ° °Our billing staff will not be able to assist you with questions regarding bills from these companies. ° °You will be contacted with the lab results as soon as they are available. The fastest way to get your results is to activate your My Chart account. Instructions are located on the last page of this paperwork. If you have not heard from us regarding the results in 2 weeks, please contact this office. °  ° ° ° °

## 2020-08-08 NOTE — Progress Notes (Signed)
Telemedicine Encounter- SOAP NOTE Established Patient  This telephone encounter was conducted with the patient's (or proxy's) verbal consent via audio telecommunications: yes  Patient was instructed to have this encounter in a suitably private space; and to only have persons present to whom they give permission to participate. In addition, patient identity was confirmed by use of name plus two identifiers (DOB and address).  I discussed the limitations, risks, security and privacy concerns of performing an evaluation and management service by telephone and the availability of in person appointments. I also discussed with the patient that there may be a patient responsible charge related to this service. The patient expressed understanding and agreed to proceed.  I spent a total of 11 minutes talking with the patient or their proxy.  Patient at home Provider in office  Chief Complaint  Patient presents with  . Otitis Media    Patient states she was not feeling well and went to [facility] a [facility] yesterday and was Dx with a left ear infection. Per patient she was prescribed Ear drops but the ear is swollen and feels worse.She would prefer taking an oral antibiotic.    Subjective   Jasmine Wu is a 50 y.o. established patient. Telephone visit today for ongoing ear pain  HPI Seen in person yesterday at clinic in [place] after 2 days of severe ear pain, pain to palpation, pressure inside, and some URI or allergy symptoms.  Unfortunately after starting Cipro-HC drops has not improved, has worsened No drainage from ear or evidence of TM rupture Endorses tenderness through tonsillar node and cervical chain L side only Has occurred before but it has been some time Has been taking zyrtec with no relief.  Patient Active Problem List   Diagnosis Date Noted  . Dermatitis 10/03/2019  . Essential hypertension 07/19/2019  . Adjustment disorder with  mixed anxiety and depressed mood 07/19/2019  . Lumbar radiculopathy, chronic 05/05/2019    Past Medical History:  Diagnosis Date  . Allergy   . Lumbar radiculopathy, chronic 05/05/2019  . Spinal stenosis, lumbar region, with neurogenic claudication 04/08/2019    Current Outpatient Medications  Medication Sig Dispense Refill  . amoxicillin-clavulanate (AUGMENTIN) 875-125 MG tablet Take 1 tablet by mouth 2 (two) times daily. 14 tablet 0  . azelastine (ASTELIN) 0.1 % nasal spray Place 1 spray into both nostrils 2 (two) times daily. Use in each nostril as directed 30 mL 12  . ciprofloxacin-hydrocortisone (CIPRO HC) OTIC suspension Place 3 drops into the left ear 2 (two) times daily for 7 days. 10 mL 0  . diclofenac Sodium (VOLTAREN) 1 % GEL Apply 2 g topically 4 (four) times daily. 50 g 3  . losartan-hydrochlorothiazide (HYZAAR) 100-25 MG tablet TAKE 1 TABLET BY MOUTH ONCE A DAY 90 tablet 0  . pantoprazole (PROTONIX) 40 MG tablet Take 1 tablet (40 mg total) by mouth daily. 90 tablet 3  . PARoxetine (PAXIL) 30 MG tablet TAKE 1 TABLET BY MOUTH EVERY DAY 90 tablet 2  . Vitamin D, Ergocalciferol, (DRISDOL) 1.25 MG (50000 UNIT) CAPS capsule Take 1 capsule (50,000 Units total) by mouth every 7 (seven) days. 12 capsule 0   No current facility-administered medications for this visit.    No Known Allergies  Social History   Socioeconomic History  . Marital status: Married    Spouse name: Not on file  . Number of children: 4  . Years of education: Not on file  . Highest  education level: Not on file  Occupational History  . Not on file  Tobacco Use  . Smoking status: Never Smoker  . Smokeless tobacco: Never Used  Vaping Use  . Vaping Use: Never used  Substance and Sexual Activity  . Alcohol use: Not Currently  . Drug use: Not Currently  . Sexual activity: Not on file  Other Topics Concern  . Not on file  Social History Narrative  . Not on file   Social Determinants of Health    Financial Resource Strain: Not on file  Food Insecurity: Not on file  Transportation Needs: Not on file  Physical Activity: Not on file  Stress: Not on file  Social Connections: Not on file  Intimate Partner Violence: Not on file    ROS Per hpi   Objective   Vitals as reported by the patient: There were no vitals filed for this visit.  Jasmine Wu was seen today for otitis media.  Diagnoses and all orders for this visit:  Non-recurrent acute suppurative otitis media of left ear without spontaneous rupture of tympanic membrane -     amoxicillin-clavulanate (AUGMENTIN) 875-125 MG tablet; Take 1 tablet by mouth 2 (two) times daily. -     azelastine (ASTELIN) 0.1 % nasal spray; Place 1 spray into both nostrils 2 (two) times daily. Use in each nostril as directed  Seasonal allergies -     azelastine (ASTELIN) 0.1 % nasal spray; Place 1 spray into both nostrils 2 (two) times daily. Use in each nostril as directed   PLAN  Continue zyrtec.  Ok to stop cipro-hc  Start augmentin po bid for 7 days  Azelastine nasal spray  Return prn  Patient encouraged to call clinic with any questions, comments, or concerns.   I discussed the assessment and treatment plan with the patient. The patient was provided an opportunity to ask questions and all were answered. The patient agreed with the plan and demonstrated an understanding of the instructions.   The patient was advised to call back or seek an in-person evaluation if the symptoms worsen or if the condition fails to improve as anticipated.  I provided 11 minutes of non-face-to-face time during this encounter.  Janeece Agee, NP  [facility] at [facility]

## 2020-08-09 ENCOUNTER — Telehealth: Payer: Self-pay | Admitting: Registered Nurse

## 2020-08-09 NOTE — Telephone Encounter (Signed)
Patient had virtual yesterday and is asking that we fax a work note  Excusing her from work yesterday and today.  Patient is asking that we fax to her work   [phone]   Please advise  Any questions call Demira at [phone]

## 2020-08-09 NOTE — Telephone Encounter (Signed)
Printed and in [employer]  to be signed and faxed after.

## 2020-08-14 ENCOUNTER — Ambulatory Visit: Payer: BC Managed Care – PPO

## 2020-08-22 ENCOUNTER — Ambulatory Visit: Payer: BC Managed Care – PPO | Admitting: Orthopaedic Surgery

## 2020-08-24 ENCOUNTER — Encounter: Payer: Self-pay | Admitting: Registered Nurse

## 2020-08-24 NOTE — Telephone Encounter (Signed)
Patient medication was sent to the pharmacy 08/07/2020

## 2020-08-30 ENCOUNTER — Ambulatory Visit: Payer: BC Managed Care – PPO | Admitting: Orthopaedic Surgery

## 2020-09-11 ENCOUNTER — Ambulatory Visit: Payer: BC Managed Care – PPO | Admitting: Orthopaedic Surgery

## 2020-09-24 ENCOUNTER — Encounter: Payer: Self-pay | Admitting: Registered Nurse

## 2020-09-24 ENCOUNTER — Telehealth: Payer: Self-pay

## 2020-09-24 ENCOUNTER — Other Ambulatory Visit: Payer: Self-pay

## 2020-09-24 ENCOUNTER — Telehealth: Payer: BC Managed Care – PPO | Admitting: Registered Nurse

## 2020-09-24 VITALS — BP 138/76 | Temp 99.0°F

## 2020-09-24 DIAGNOSIS — J22 Unspecified acute lower respiratory infection: Secondary | ICD-10-CM

## 2020-09-24 DIAGNOSIS — R059 Cough, unspecified: Secondary | ICD-10-CM | POA: Diagnosis not present

## 2020-09-24 MED ORDER — BENZONATATE 100 MG PO CAPS: 100.0000 mg | ORAL_CAPSULE | Freq: Two times a day (BID) | ORAL | 0 refills | Status: DC | PRN

## 2020-09-24 MED ORDER — AZITHROMYCIN 250 MG PO TABS: ORAL_TABLET | ORAL | 0 refills | Status: AC

## 2020-09-24 MED ORDER — PREDNISONE 20 MG PO TABS: 20.0000 mg | ORAL_TABLET | Freq: Every day | ORAL | 0 refills | Status: DC

## 2020-09-24 MED ORDER — GUAIFENESIN-CODEINE 100-10 MG/5ML PO SOLN: 5.0000 mL | Freq: Every evening | ORAL | 0 refills | Status: DC | PRN

## 2020-09-24 NOTE — Progress Notes (Signed)
Telemedicine Encounter- SOAP NOTE Established Patient  This telephone encounter was conducted with the patient's (or proxy's) verbal consent via audio telecommunications: yes  Patient was instructed to have this encounter in a suitably private space; and to only have persons present to whom they give permission to participate. In addition, patient identity was confirmed by use of name plus two identifiers (DOB and address).  I discussed the limitations, risks, security and privacy concerns of performing an evaluation and management service by telephone and the availability of in person appointments. I also discussed with the patient that there may be a patient responsible charge related to this service. The patient expressed understanding and agreed to proceed.  I spent a total of 15 minutes talking with the patient or their proxy.  Patient at home Provider in office  Participants: Jari Sportsman, NP and Odella Aquas  Chief Complaint  Patient presents with  . Cough    Patient states she has been experiencing a sore throat, cough, and congestion for about a week. Patient has been taking OTC medications but nothing seems  to be helping. She took a COVID test yesterday but the test was negative.    Subjective   Jasmine Wu is a 50 y.o. established patient. Telephone visit today for cough  HPI Ongoing over 1 week. Has tried sudafed, azelastine, zyrtec, and more. No effect whatsoever Productive cough from chest. Barking cough Has been around parents who are covid + but she has tested daily since return from their home and has been negative every time. No other sick contacts. Notes that this feels very different from symptoms in March when she had AOM. This feels like it has settled in her chest Hx of bacterial bronchitis, this feels similar to that. Does note feeling a little feverish, and having some sweats at night.  No further concerns Denies LOC, lightheadedness, chest  pain, palpitations, nvd  Patient Active Problem List   Diagnosis Date Noted  . Dermatitis 10/03/2019  . Essential hypertension 07/19/2019  . Adjustment disorder with mixed anxiety and depressed mood 07/19/2019  . Lumbar radiculopathy, chronic 05/05/2019    Past Medical History:  Diagnosis Date  . Allergy   . Lumbar radiculopathy, chronic 05/05/2019  . Spinal stenosis, lumbar region, with neurogenic claudication 04/08/2019    Current Outpatient Medications  Medication Sig Dispense Refill  . azelastine (ASTELIN) 0.1 % nasal spray Place 1 spray into both nostrils 2 (two) times daily. Use in each nostril as directed 30 mL 12  . azithromycin (ZITHROMAX) 250 MG tablet Take 2 tablets on day 1, then 1 tablet daily on days 2 through 5 6 tablet 0  . benzonatate (TESSALON) 100 MG capsule Take 1 capsule (100 mg total) by mouth 2 (two) times daily as needed for cough. 20 capsule 0  . diclofenac Sodium (VOLTAREN) 1 % GEL Apply 2 g topically 4 (four) times daily. 50 g 3  . guaiFENesin-codeine 100-10 MG/5ML syrup Take 5 mLs by mouth at bedtime as needed for cough. 120 mL 0  . losartan-hydrochlorothiazide (HYZAAR) 100-25 MG tablet TAKE 1 TABLET BY MOUTH ONCE A DAY 90 tablet 0  . pantoprazole (PROTONIX) 40 MG tablet Take 1 tablet (40 mg total) by mouth daily. 90 tablet 3  . PARoxetine (PAXIL) 30 MG tablet TAKE 1 TABLET BY MOUTH EVERY DAY 90 tablet 2  . predniSONE (DELTASONE) 20 MG tablet Take 1 tablet (20 mg total) by mouth daily with breakfast. 5 tablet 0  . Vitamin D, Ergocalciferol, (  DRISDOL) 1.25 MG (50000 UNIT) CAPS capsule Take 1 capsule (50,000 Units total) by mouth every 7 (seven) days. 12 capsule 0  . amoxicillin-clavulanate (AUGMENTIN) 875-125 MG tablet Take 1 tablet by mouth 2 (two) times daily. 14 tablet 0   No current facility-administered medications for this visit.    No Known Allergies  Social History   Socioeconomic History  . Marital status: Married    Spouse name: Not on file   . Number of children: 4  . Years of education: Not on file  . Highest education level: Not on file  Occupational History  . Not on file  Tobacco Use  . Smoking status: Never Smoker  . Smokeless tobacco: Never Used  Vaping Use  . Vaping Use: Never used  Substance and Sexual Activity  . Alcohol use: Not Currently  . Drug use: Not Currently  . Sexual activity: Not on file  Other Topics Concern  . Not on file  Social History Narrative  . Not on file   Social Determinants of Health   Financial Resource Strain: Not on file  Food Insecurity: Not on file  Transportation Needs: Not on file  Physical Activity: Not on file  Stress: Not on file  Social Connections: Not on file  Intimate Partner Violence: Not on file    ROS Per hpi   Objective   Vitals as reported by the patient: Today's Vitals   09/24/20 1129  BP: 138/76  Temp: 99 F (37.2 C)  TempSrc: Temporal    [name] was seen today for cough.  Diagnoses and all orders for this visit:  Lower respiratory infection -     azithromycin (ZITHROMAX) 250 MG tablet; Take 2 tablets on day 1, then 1 tablet daily on days 2 through 5 -     predniSONE (DELTASONE) 20 MG tablet; Take 1 tablet (20 mg total) by mouth daily with breakfast. -     benzonatate (TESSALON) 100 MG capsule; Take 1 capsule (100 mg total) by mouth 2 (two) times daily as needed for cough. -     guaiFENesin-codeine 100-10 MG/5ML syrup; Take 5 mLs by mouth at bedtime as needed for cough.  Cough -     predniSONE (DELTASONE) 20 MG tablet; Take 1 tablet (20 mg total) by mouth daily with breakfast. -     benzonatate (TESSALON) 100 MG capsule; Take 1 capsule (100 mg total) by mouth 2 (two) times daily as needed for cough. -     guaiFENesin-codeine 100-10 MG/5ML syrup; Take 5 mLs by mouth at bedtime as needed for cough.   PLAN  Given symptoms and duration, suspect bacterial lower respiratory infection  z pack, prednisone burst  Tessalon and guaifenesin-codeine  for symptom relief  Return if worsening or failing to improve. May warrant cxr if no improvement  Patient encouraged to call clinic with any questions, comments, or concerns.   I discussed the assessment and treatment plan with the patient. The patient was provided an opportunity to ask questions and all were answered. The patient agreed with the plan and demonstrated an understanding of the instructions.   The patient was advised to call back or seek an in-person evaluation if the symptoms worsen or if the condition fails to improve as anticipated.  I provided 15 minutes of non-face-to-face time during this encounter.  Janeece Agee, NP  [facility] at [facility]

## 2020-09-24 NOTE — Patient Instructions (Signed)
° ° ° °  If you have lab work done today you will be contacted with your lab results within the next 2 weeks.  If you have not heard from us then please contact us. The fastest way to get your results is to register for My Chart. ° ° °IF you received an x-ray today, you will receive an invoice from [facility]. Please contact  Radiology at [phone] with questions or concerns regarding your invoice.  ° °IF you received labwork today, you will receive an invoice from [facility]. Please contact [facility] at [phone] with questions or concerns regarding your invoice.  ° °Our billing staff will not be able to assist you with questions regarding bills from these companies. ° °You will be contacted with the lab results as soon as they are available. The fastest way to get your results is to activate your My Chart account. Instructions are located on the last page of this paperwork. If you have not heard from us regarding the results in 2 weeks, please contact this office. °  ° ° ° °

## 2020-09-24 NOTE — Telephone Encounter (Signed)
Patient states she forgot to ask, when would it be safe for her to return back to work. Also asked for a work note if she is not able to return to work with her symptoms.

## 2020-10-11 ENCOUNTER — Other Ambulatory Visit: Payer: Self-pay | Admitting: Registered Nurse

## 2020-10-11 DIAGNOSIS — E559 Vitamin D deficiency, unspecified: Secondary | ICD-10-CM

## 2020-10-25 ENCOUNTER — Other Ambulatory Visit: Payer: Self-pay | Admitting: Registered Nurse

## 2020-10-25 DIAGNOSIS — E559 Vitamin D deficiency, unspecified: Secondary | ICD-10-CM

## 2020-11-20 ENCOUNTER — Other Ambulatory Visit: Payer: Self-pay | Admitting: Registered Nurse

## 2020-11-21 MED ORDER — LOSARTAN POTASSIUM-HCTZ 100-25 MG PO TABS: 1.0000 | ORAL_TABLET | Freq: Every day | ORAL | 0 refills | Status: DC

## 2020-12-12 ENCOUNTER — Other Ambulatory Visit: Payer: Self-pay | Admitting: Registered Nurse

## 2020-12-12 ENCOUNTER — Other Ambulatory Visit: Payer: Self-pay

## 2020-12-12 DIAGNOSIS — F4323 Adjustment disorder with mixed anxiety and depressed mood: Secondary | ICD-10-CM

## 2020-12-13 LAB — CMP12+LP+TP+TSH+6AC+CBC/D/PLT
ALT: 21 IU/L (ref 0–32)
AST: 19 IU/L (ref 0–40)
Albumin/Globulin Ratio: 1.6 (ref 1.2–2.2)
Albumin: 4.2 g/dL (ref 3.8–4.9)
Alkaline Phosphatase: 87 IU/L (ref 44–121)
BUN/Creatinine Ratio: 25 — ABNORMAL HIGH (ref 9–23)
BUN: 15 mg/dL (ref 6–24)
Basophils Absolute: 0.1 10*3/uL (ref 0.0–0.2)
Basos: 1 %
Bilirubin Total: 0.2 mg/dL (ref 0.0–1.2)
Calcium: 10.4 mg/dL — ABNORMAL HIGH (ref 8.7–10.2)
Chloride: 97 mmol/L (ref 96–106)
Chol/HDL Ratio: 3.4 ratio (ref 0.0–4.4)
Cholesterol, Total: 168 mg/dL (ref 100–199)
Creatinine, Ser: 0.59 mg/dL (ref 0.57–1.00)
EOS (ABSOLUTE): 0.4 10*3/uL (ref 0.0–0.4)
Eos: 4 %
Estimated CHD Risk: 0.5 times avg. (ref 0.0–1.0)
Free Thyroxine Index: 1.6 (ref 1.2–4.9)
GGT: 24 IU/L (ref 0–60)
Globulin, Total: 2.7 g/dL (ref 1.5–4.5)
Glucose: 114 mg/dL — ABNORMAL HIGH (ref 65–99)
HDL: 49 mg/dL (ref >39)
Hematocrit: 40.2 % (ref 34.0–46.6)
Hemoglobin: 12.5 g/dL (ref 11.1–15.9)
Immature Grans (Abs): 0.1 10*3/uL (ref 0.0–0.1)
Immature Granulocytes: 1 %
Iron: 57 ug/dL (ref 27–159)
LDL Chol Calc (NIH): 95 mg/dL (ref 0–99)
Lymphocytes Absolute: 2.3 10*3/uL (ref 0.7–3.1)
Lymphs: 22 %
MCH: 26.1 pg — ABNORMAL LOW (ref 26.6–33.0)
MCHC: 31.1 g/dL — ABNORMAL LOW (ref 31.5–35.7)
MCV: 84 fL (ref 79–97)
Monocytes Absolute: 0.5 10*3/uL (ref 0.1–0.9)
Monocytes: 5 %
Neutrophils Absolute: 7.2 10*3/uL — ABNORMAL HIGH (ref 1.4–7.0)
Neutrophils: 67 %
Phosphorus: 3.9 mg/dL (ref 3.0–4.3)
Platelets: 396 10*3/uL (ref 150–450)
Potassium: 4.8 mmol/L (ref 3.5–5.2)
RBC: 4.79 x10E6/uL (ref 3.77–5.28)
RDW: 13.2 % (ref 11.7–15.4)
Sodium: 137 mmol/L (ref 134–144)
T3 Uptake Ratio: 18 % — ABNORMAL LOW (ref 24–39)
T4, Total: 9.1 ug/dL (ref 4.5–12.0)
TSH: 2.25 u[IU]/mL (ref 0.450–4.500)
Total Protein: 6.9 g/dL (ref 6.0–8.5)
Triglycerides: 136 mg/dL (ref 0–149)
Uric Acid: 4.1 mg/dL (ref 3.0–7.2)
VLDL Cholesterol Cal: 24 mg/dL (ref 5–40)
WBC: 10.6 10*3/uL (ref 3.4–10.8)
eGFR: 108 mL/min/{1.73_m2} (ref >59)

## 2021-01-10 ENCOUNTER — Other Ambulatory Visit: Payer: Self-pay | Admitting: Registered Nurse

## 2021-01-10 DIAGNOSIS — F4323 Adjustment disorder with mixed anxiety and depressed mood: Secondary | ICD-10-CM

## 2021-02-12 ENCOUNTER — Other Ambulatory Visit: Payer: Self-pay | Admitting: Registered Nurse

## 2021-02-12 DIAGNOSIS — F4323 Adjustment disorder with mixed anxiety and depressed mood: Secondary | ICD-10-CM

## 2021-03-07 ENCOUNTER — Encounter: Payer: Self-pay | Admitting: Family Medicine

## 2021-03-07 ENCOUNTER — Other Ambulatory Visit: Payer: Self-pay

## 2021-03-07 ENCOUNTER — Telehealth (INDEPENDENT_AMBULATORY_CARE_PROVIDER_SITE_OTHER): Payer: BC Managed Care – PPO | Admitting: Family Medicine

## 2021-03-07 DIAGNOSIS — H6692 Otitis media, unspecified, left ear: Secondary | ICD-10-CM

## 2021-03-07 DIAGNOSIS — U071 COVID-19: Secondary | ICD-10-CM | POA: Diagnosis not present

## 2021-03-07 DIAGNOSIS — R051 Acute cough: Secondary | ICD-10-CM | POA: Diagnosis not present

## 2021-03-07 MED ORDER — AMOXICILLIN 875 MG PO TABS: 875.0000 mg | ORAL_TABLET | Freq: Two times a day (BID) | ORAL | 0 refills | Status: AC

## 2021-03-07 MED ORDER — BENZONATATE 100 MG PO CAPS: 100.0000 mg | ORAL_CAPSULE | Freq: Three times a day (TID) | ORAL | 0 refills | Status: DC | PRN

## 2021-03-07 NOTE — Patient Instructions (Signed)
I am glad to hear that you are feeling better overall.  Amoxicillin antibiotic sent to your pharmacy for possible left ear infection.  If that is not improving in the next 4 to 5 days or worsening prior to that time, I do recommend an in person evaluation with a medical provider.  Tessalon Perles up to 3 times per day as needed for cough.  Robitussin-DM is okay to use as well.  Make sure to drink plenty of fluids.  Watch blood pressures if you continue Sudafed as that may increase blood pressure.  Okay to return to work this upcoming Monday as long as you are wearing a well fitting mask throughout the entire day.  See information below.  Hope you continue to improve but let us know if there are questions.  Your employer may have specific requirements for return to work, but this information is provided from the CDC. There is a link provided below for more information if needed.   Everyone who has presumed or confirmed COVID-19 should stay home and isolate from other people for at least 5 full days (day 0 is the first day of symptoms or the date of the day of the positive viral test for asymptomatic persons). You can end isolation after 5 full days if you are fever-free for 24 hours without the use of fever-reducing medication and your other symptoms have improved (Loss of taste and smell may persist for weeks or months after recovery and need not delay the end of isolation). You should continue to wear a well-fitting mask around others at home and in public for 5 additional days (day 6 through day 10) after the end of your 5-day isolation period. If you are unable to wear a mask when around others, you should continue to isolate for a full 10 days. Avoid people who have weakened immune systems or are more likely to get very sick from COVID-19, and nursing homes and other high-risk settings, until after at least 10 days.  If you continue to have fever or your other symptoms have not improved after 5 days of  isolation, you should wait to end your isolation until you are fever-free for 24 hours without the use of fever-reducing medication and your other symptoms have improved. Continue to wear a well-fitting mask through day 10.   [email or web address]

## 2021-03-07 NOTE — Progress Notes (Signed)
Virtual Visit via Video Note  I connected with Jasmine Wu on 03/07/21 at 9:11 AM by a video enabled telemedicine application and verified that I am speaking with the correct person using two identifiers.  Patient location:home My location: office - [place]   I discussed the limitations, risks, security and privacy concerns of performing an evaluation and management service by telephone and the availability of in person appointments. I also discussed with the patient that there may be a patient responsible charge related to this service. The patient expressed understanding and agreed to proceed, consent obtained  Chief complaint:  Chief Complaint  Patient presents with   Covid Positive    Pt reports tested positive Friday and sxs worsened Saturday, feels pressure in LT ear fullness, congested, cough, reports fever previously and low grade fever now, controlled with tylenol     History of Present Illness: Jasmine Wu is a 50 y.o. female  Covid 19 infection: Initial symptoms of headache, fatigue on 10/7. Tested as son had covid at that time.  Positive test Friday 03/01/21. More symptoms next day - fever up to 102, body aches, headache, congestion, cough. Ears congested. No drainage/discharge from ear, but persistent left ear pain. Current treatments: ibuprofen, tylenol, sudafed, robitussin DM. Still some cough at night. Overall better from last Saturday except left ear pain and cough. Initial stuffiness in ear, now painful. Hearing ok. ? Redness in canal with use of cell  No dyspnea art rest, no chest pain, no confusion/disorientation, drinking fluids.    Covid 19 vaccine: initial vaccine, no booster d/t side effect with vaccine.   Risk of complication score of 2 (obesity).    Patient Active Problem List   Diagnosis Date Noted   Dermatitis 10/03/2019   Essential hypertension 07/19/2019   Adjustment disorder with mixed anxiety and depressed mood 07/19/2019   Lumbar  radiculopathy, chronic 05/05/2019   Past Medical History:  Diagnosis Date   Allergy    Lumbar radiculopathy, chronic 05/05/2019   Spinal stenosis, lumbar region, with neurogenic claudication 04/08/2019   Past Surgical History:  Procedure Laterality Date   ABDOMINAL HYSTERECTOMY  2019   one ovary left    CESAREAN SECTION  2000   CESAREAN SECTION  2003   HAND SURGERY     capal tunnell  2012  right   No Known Allergies Prior to Admission medications   Medication Sig Start Date End Date Taking? Authorizing Provider  azelastine (ASTELIN) 0.1 % nasal spray Place 1 spray into both nostrils 2 (two) times daily. Use in each nostril as directed 08/08/20  Yes Janeece Agee, NP  diclofenac Sodium (VOLTAREN) 1 % GEL Apply 2 g topically 4 (four) times daily. 08/01/20  Yes Janeece Agee, NP  guaiFENesin-codeine 100-10 MG/5ML syrup Take 5 mLs by mouth at bedtime as needed for cough. 09/24/20  Yes Janeece Agee, NP  losartan-hydrochlorothiazide (HYZAAR) 100-25 MG tablet Take 1 tablet by mouth daily. 11/21/20  Yes Janeece Agee, NP  pantoprazole (PROTONIX) 40 MG tablet Take 1 tablet (40 mg total) by mouth daily. 01/27/20  Yes Janeece Agee, NP  PARoxetine (PAXIL) 30 MG tablet TAKE 1 TABLET BY MOUTH EVERY DAY 02/12/21  Yes Janeece Agee, NP  predniSONE (DELTASONE) 20 MG tablet Take 1 tablet (20 mg total) by mouth daily with breakfast. 09/24/20  Yes Janeece Agee, NP  Vitamin D, Ergocalciferol, (DRISDOL) 1.25 MG (50000 UNIT) CAPS capsule TAKE 1 CAPSULE (50,000 UNITS TOTAL) BY MOUTH EVERY 7 (SEVEN) DAYS 10/26/20  Yes Janeece Agee, NP  amoxicillin-clavulanate (  AUGMENTIN) 875-125 MG tablet Take 1 tablet by mouth 2 (two) times daily. 08/08/20   Janeece Agee, NP  benzonatate (TESSALON) 100 MG capsule Take 1 capsule (100 mg total) by mouth 2 (two) times daily as needed for cough. Patient not taking: Reported on 03/07/2021 09/24/20   Janeece Agee, NP   Social History   Socioeconomic History   Marital  status: Married    Spouse name: Not on file   Number of children: 4   Years of education: Not on file   Highest education level: Not on file  Occupational History   Not on file  Tobacco Use   Smoking status: Never   Smokeless tobacco: Never  Vaping Use   Vaping Use: Never used  Substance and Sexual Activity   Alcohol use: Not Currently   Drug use: Not Currently   Sexual activity: Not on file  Other Topics Concern   Not on file  Social History Narrative   Not on file   Social Determinants of Health   Financial Resource Strain: Not on file  Food Insecurity: Not on file  Transportation Needs: Not on file  Physical Activity: Not on file  Stress: Not on file  Social Connections: Not on file  Intimate Partner Violence: Not on file    Observations/Objective: There were no vitals filed for this visit. Nontoxic appearance on video, speaking in full sentences, no respiratory distress, no audible stridor or wheeze.  Did not cough a few times during visit, brief episode.  Euthymic mood.  All questions were answered with understanding of plan expressed.  Assessment and Plan: COVID-19 virus infection  Left otitis media, unspecified otitis media type - Plan: amoxicillin (AMOXIL) 875 MG tablet  Acute cough - Plan: benzonatate (TESSALON) 100 MG capsule  COVID-19 infection with overall improving symptoms.  Likely secondary otitis media on the left with initial congestion which is now painful.  Start amoxicillin, potential side effects and risks of antibiotics discussed.  Tessalon Perles for cough, over-the-counter antipyretics, fluids, Sudafed and Robitussin-DM okay for now but did recommend monitoring her blood pressure with use of those medications.  Okay to return to work this upcoming Monday with mask usage as that will be day 10.  RTC precautions/urgent care/ER precautions given.  Follow Up Instructions: As needed, urgent care/ER precautions   I discussed the assessment and  treatment plan with the patient. The patient was provided an opportunity to ask questions and all were answered. The patient agreed with the plan and demonstrated an understanding of the instructions.   The patient was advised to call back or seek an in-person evaluation if the symptoms worsen or if the condition fails to improve as anticipated.  Shade Flood, MD

## 2021-03-18 ENCOUNTER — Other Ambulatory Visit: Payer: Self-pay | Admitting: Registered Nurse

## 2021-03-25 DIAGNOSIS — R300 Dysuria: Secondary | ICD-10-CM | POA: Diagnosis not present

## 2021-03-25 DIAGNOSIS — N3000 Acute cystitis without hematuria: Secondary | ICD-10-CM | POA: Diagnosis not present

## 2021-03-28 ENCOUNTER — Other Ambulatory Visit: Payer: Self-pay | Admitting: Registered Nurse

## 2021-06-02 ENCOUNTER — Other Ambulatory Visit: Payer: Self-pay | Admitting: Registered Nurse

## 2021-06-03 MED ORDER — LOSARTAN POTASSIUM-HCTZ 100-25 MG PO TABS: 1.0000 | ORAL_TABLET | Freq: Every day | ORAL | 0 refills | Status: DC

## 2021-06-03 MED ORDER — PANTOPRAZOLE SODIUM 40 MG PO TBEC: 40.0000 mg | DELAYED_RELEASE_TABLET | Freq: Every day | ORAL | 0 refills | Status: DC

## 2021-07-31 ENCOUNTER — Other Ambulatory Visit: Payer: Self-pay | Admitting: Registered Nurse

## 2021-08-27 ENCOUNTER — Other Ambulatory Visit: Payer: Self-pay | Admitting: Registered Nurse

## 2021-08-30 DIAGNOSIS — H9201 Otalgia, right ear: Secondary | ICD-10-CM | POA: Diagnosis not present

## 2021-10-18 IMAGING — MR MR LUMBAR SPINE W/O CM
4 of 5 series · 18 of 48 positions shown · non-contrast
Comparison: None.

CLINICAL DATA: Low back pain with bilateral hip, buttock and leg
pain over the last year.

EXAM:
MRI LUMBAR SPINE WITHOUT CONTRAST
TECHNIQUE: Multiplanar, multisequence MR imaging of the lumbar spine was
performed. No intravenous contrast was administered.

[Series 6: T2 · sagittal · 4.0mm · 0.73mm/px · 6 of 15 slices shown (1 of 2)]
[im 1/15]
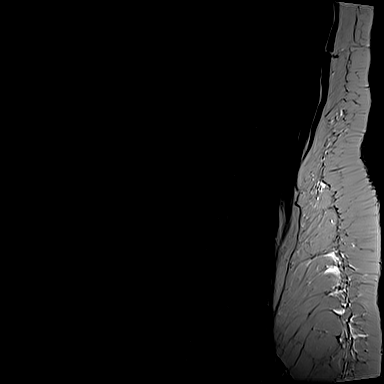
[im 3/15]
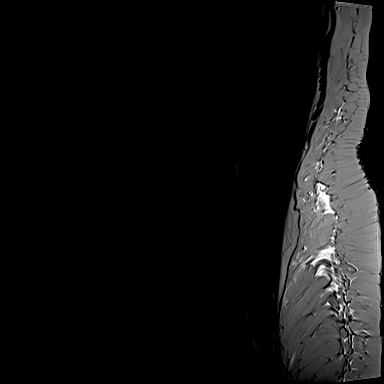
[im 6/15]
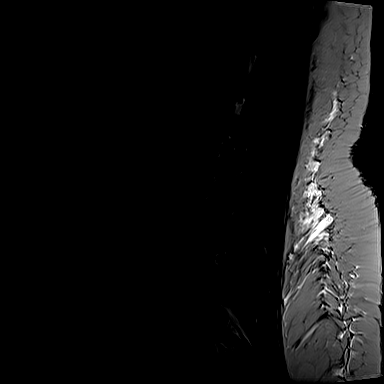
[im 9/15]
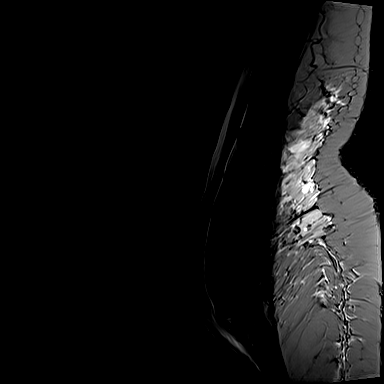
[im 12/15]
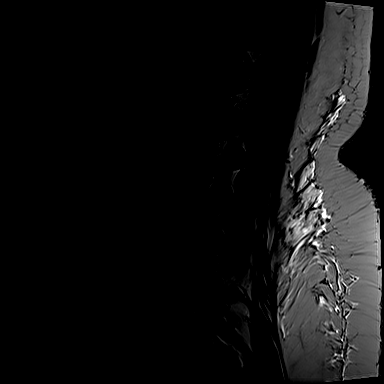
[im 15/15]
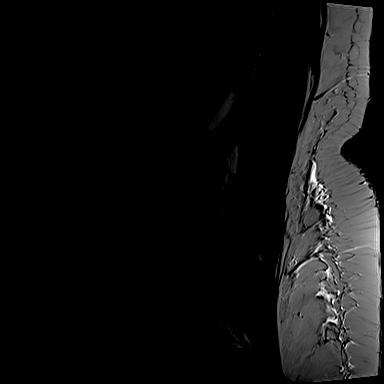

[Series 7: T1 · sagittal · 4.0mm · 0.73mm/px · 3 of 15 slices shown (1 of 2)]
[im 3/15]
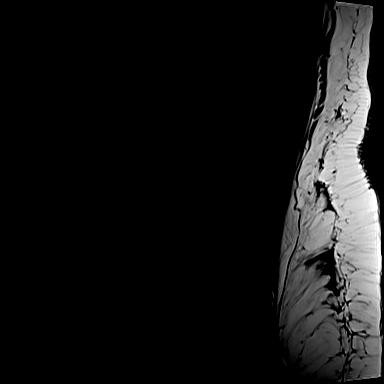
[im 9/15]
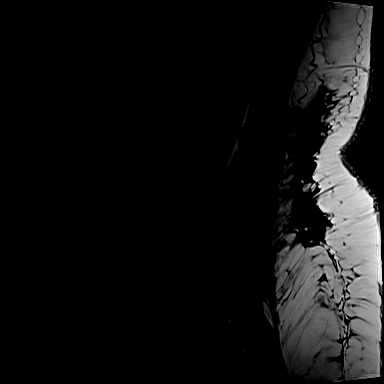
[im 15/15]
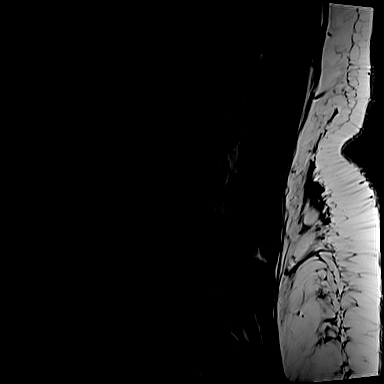

[Series 11: T1 · axial · 4.0mm · 0.28mm/px · z∈[-141,+20]mm · 3 of 40 slices shown (2 of 2)]
[im 6/40]
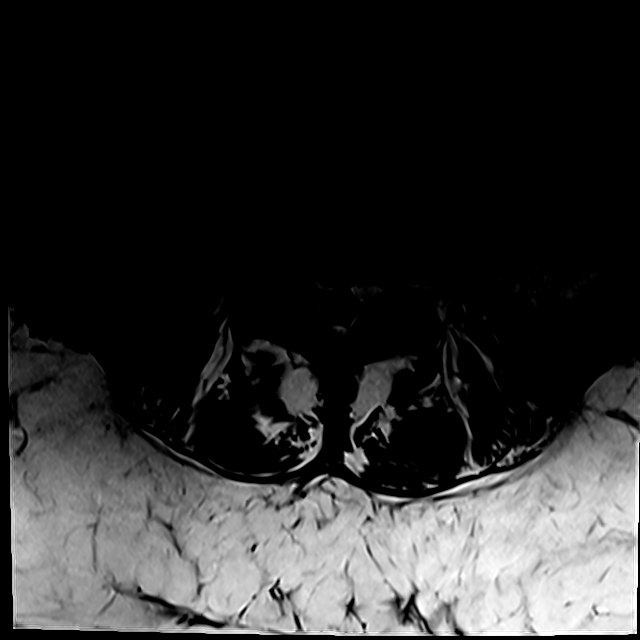
[im 20/40]
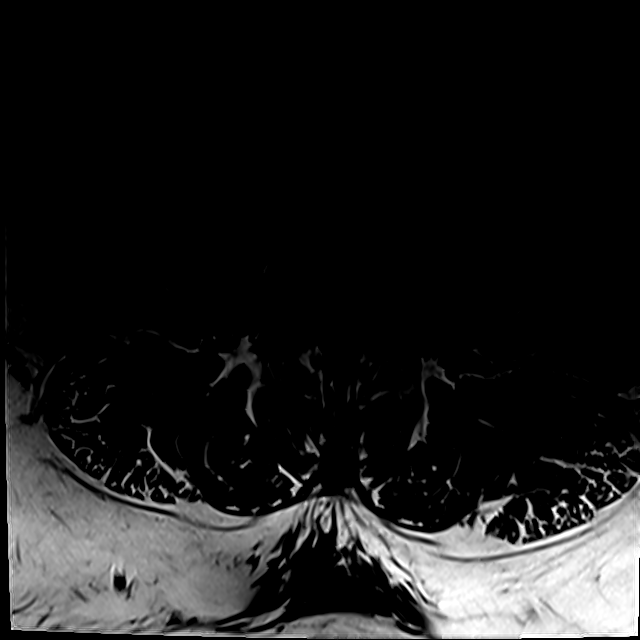
[im 34/40]
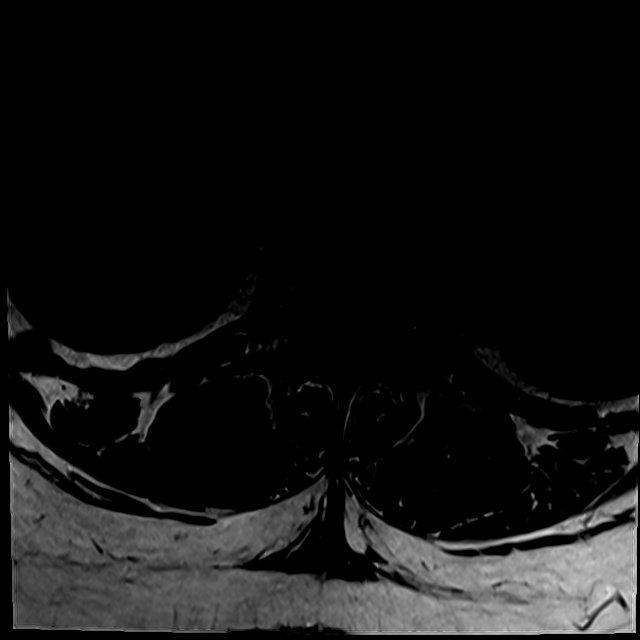

[Series 14: T2 · axial · 4.0mm · 0.28mm/px · z∈[-166,+20]mm · 6 of 40 slices shown (2 of 2)]
[im 1/40]
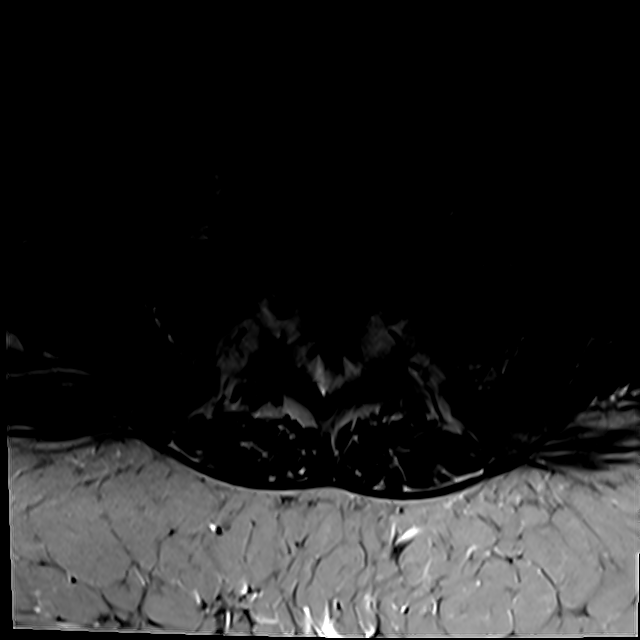
[im 6/40]
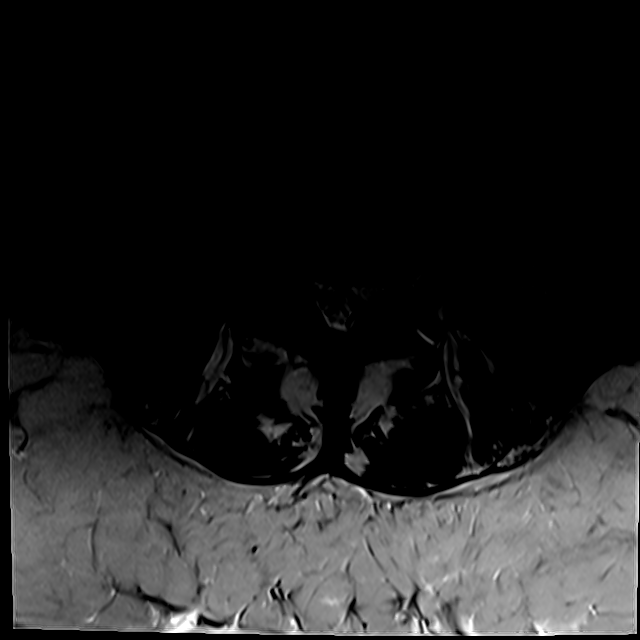
[im 12/40]
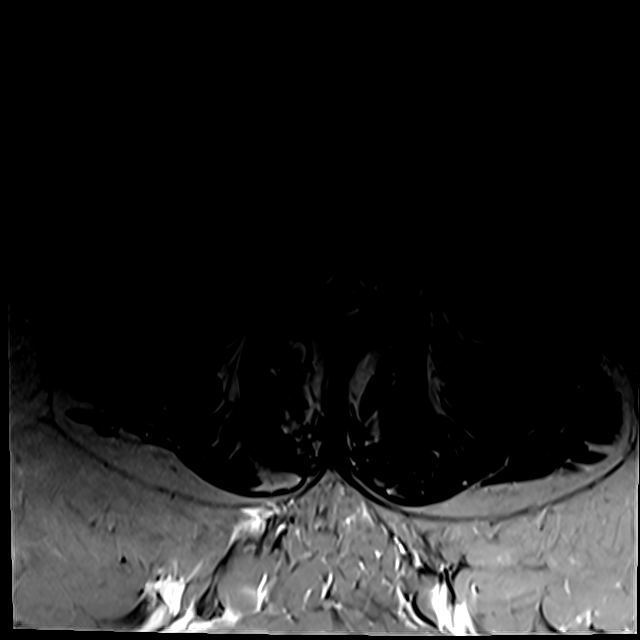
[im 17/40]
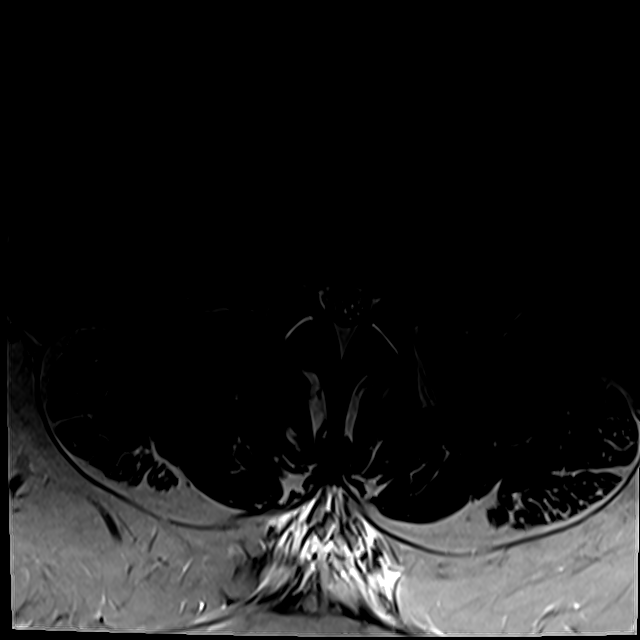
[im 20/40]
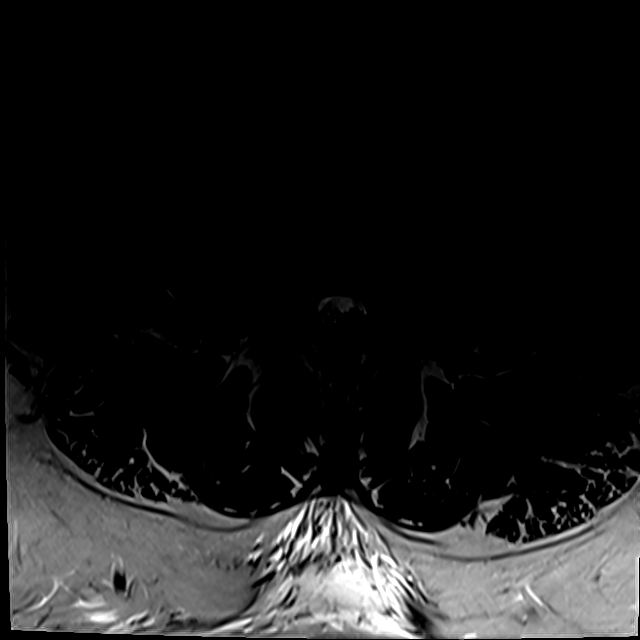
[im 34/40]
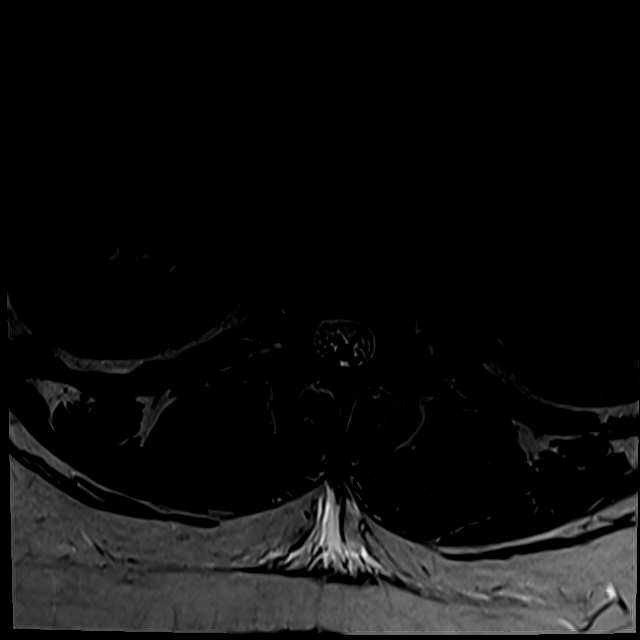

[18 of 48 positions shown; findings below may reference images not displayed]

FINDINGS: Segmentation:  5 lumbar type vertebral bodies.

Alignment:  Normal

Vertebrae:  Normal

Conus medullaris and cauda equina: Conus extends to the L1 level.
Conus and cauda equina appear normal.

Paraspinal and other soft tissues: Normal

Disc levels:

No abnormality at L1-2 or above.

L2-3: Minimal disc bulge.  Minimal facet hypertrophy.  No stenosis.

L3-4: Minimal disc bulge.  Minimal facet hypertrophy.  No stenosis.

L4-5: Minimal disc bulge. Mild bilateral facet osteoarthritis. No
compressive stenosis. The facet arthropathy could be a cause of back
pain or referred facet syndrome pain.

L5-S1: No disc abnormality. Mild bilateral facet osteoarthritis. No
compressive stenosis. The facet arthropathy could be a cause of back
pain or referred facet syndrome pain.
IMPRESSION: No advanced finding. Mild non-compressive disc bulges at L2-3, L3-4
and L4-5.

Facet osteoarthritis at L4-5 and L5-S1 which could be a cause of
back pain or referred facet syndrome pain. No slippage or neural
compression.

## 2021-11-01 ENCOUNTER — Other Ambulatory Visit: Payer: Self-pay | Admitting: Registered Nurse

## 2021-11-18 ENCOUNTER — Ambulatory Visit: Payer: BC Managed Care – PPO | Admitting: Registered Nurse

## 2021-11-25 ENCOUNTER — Other Ambulatory Visit: Payer: Self-pay | Admitting: Registered Nurse

## 2021-12-02 ENCOUNTER — Ambulatory Visit: Payer: BC Managed Care – PPO | Admitting: Registered Nurse

## 2021-12-03 ENCOUNTER — Ambulatory Visit: Payer: BC Managed Care – PPO | Admitting: Registered Nurse

## 2021-12-16 ENCOUNTER — Ambulatory Visit: Payer: BC Managed Care – PPO | Admitting: Registered Nurse

## 2021-12-16 ENCOUNTER — Encounter: Payer: Self-pay | Admitting: Registered Nurse

## 2021-12-16 ENCOUNTER — Other Ambulatory Visit: Payer: Self-pay

## 2021-12-16 VITALS — BP 114/68 | HR 101 | Temp 98.2°F | Resp 18 | Ht 64.0 in | Wt 308.3 lb

## 2021-12-16 DIAGNOSIS — R5382 Chronic fatigue, unspecified: Secondary | ICD-10-CM

## 2021-12-16 DIAGNOSIS — R52 Pain, unspecified: Secondary | ICD-10-CM

## 2021-12-16 DIAGNOSIS — R202 Paresthesia of skin: Secondary | ICD-10-CM

## 2021-12-16 DIAGNOSIS — R682 Dry mouth, unspecified: Secondary | ICD-10-CM | POA: Diagnosis not present

## 2021-12-16 DIAGNOSIS — H04123 Dry eye syndrome of bilateral lacrimal glands: Secondary | ICD-10-CM | POA: Diagnosis not present

## 2021-12-16 NOTE — Patient Instructions (Addendum)
Ms. Behrmann-   Randie Heinz to see you!  Let's figure out what's happening  I'll be in touch with lab results starting tomorrow.  I recommend these providers:  Jarold Motto, PA Jacquiline Doe, MD Glenetta Hew, MD Letta Moynahan Early, NP Jiles Prows, DNP  Thank you for letting me take part in your care!  Rich    If you have lab work done today you will be contacted with your lab results within the next 2 weeks.  If you have not heard from [place] then please contact us. The fastest way to get your results is to register for My Chart.   IF you received an x-ray today, you will receive an invoice from [facility]. Please contact [facility] at [phone] with questions or concerns regarding your invoice.   IF you received labwork today, you will receive an invoice from [facility]. Please contact [facility] at [phone] with questions or concerns regarding your invoice.   Our billing staff will not be able to assist you with questions regarding bills from these companies.  You will be contacted with the lab results as soon as they are available. The fastest way to get your results is to activate your My Chart account. Instructions are located on the last page of this paperwork. If you have not heard from [place] regarding the results in 2 weeks, please contact this office.

## 2021-12-16 NOTE — Progress Notes (Signed)
Established Patient Office Visit  Subjective:  Patient ID: Jasmine Wu, female    DOB: [birth year]  Age: 51 y.o. MRN: [id number]  CC:  Chief Complaint  Patient presents with   Dry Eye    Patient states she has been having some problems with dry mouth , dry eyes and dry vaginal area. She is also experiencing some choking when eating from not enough saliva    HPI Meridee Branum presents for dry eye, mouth  Ongoing for a few mo Stable. Feels thirsty all the time. Hoarse voice. At times trouble swallowing   Does note some generalized aches and pains.   Some cracks in corners of mouth and on tongue. It has been sore at times.   Some nausea, no vomiting or diarrhea. Has had some bowel inconsistency - but consistent inconsistency.  Still has paresthesias ongoing in extremities. Stable, not worsening.   More headaches lately - they are harder to get rid of with OTC medication use.   Outpatient Medications Prior to Visit  Medication Sig Dispense Refill   azelastine (ASTELIN) 0.1 % nasal spray Place 1 spray into both nostrils 2 (two) times daily. Use in each nostril as directed 30 mL 12   diclofenac Sodium (VOLTAREN) 1 % GEL Apply 2 g topically 4 (four) times daily. 50 g 3   losartan-hydrochlorothiazide (HYZAAR) 100-25 MG tablet TAKE 1 TABLET BY MOUTH ONCE A DAY 90 tablet 0   pantoprazole (PROTONIX) 40 MG tablet TAKE 1 TABLET BY MOUTH EVERY DAY 90 tablet 0   PARoxetine (PAXIL) 30 MG tablet TAKE 1 TABLET BY MOUTH EVERY DAY 90 tablet 3   Vitamin D, Ergocalciferol, (DRISDOL) 1.25 MG (50000 UNIT) CAPS capsule TAKE 1 CAPSULE (50,000 UNITS TOTAL) BY MOUTH EVERY 7 (SEVEN) DAYS 12 capsule 0   benzonatate (TESSALON) 100 MG capsule Take 1 capsule (100 mg total) by mouth 3 (three) times daily as needed for cough. (Patient not taking: Reported on 12/16/2021) 20 capsule 0   guaiFENesin-codeine 100-10 MG/5ML syrup Take 5 mLs by mouth at bedtime as needed for cough. (Patient not taking: Reported  on 12/16/2021) 120 mL 0   predniSONE (DELTASONE) 20 MG tablet Take 1 tablet (20 mg total) by mouth daily with breakfast. (Patient not taking: Reported on 12/16/2021) 5 tablet 0   No facility-administered medications prior to visit.    Review of Systems Per hpi     Objective:     BP 114/68   Pulse (!) 101   Temp 98.2 F (36.8 C) (Temporal)   Resp 18   Ht 5\' 4"  (1.626 m)   Wt (!) 308 lb 4.8 oz (139.8 kg)   SpO2 96%   BMI 52.92 kg/m   Wt Readings from Last 3 Encounters:  12/16/21 (!) 308 lb 4.8 oz (139.8 kg)  08/07/20 (!) 307 lb (139.3 kg)  08/01/20 (!) 308 lb 12.8 oz (140.1 kg)   Physical Exam Vitals and nursing note reviewed.  Constitutional:      General: She is not in acute distress.    Appearance: Normal appearance. She is normal weight. She is not ill-appearing, toxic-appearing or diaphoretic.  Cardiovascular:     Rate and Rhythm: Normal rate and regular rhythm.     Heart sounds: Normal heart sounds. No murmur heard.    No friction rub. No gallop.  Pulmonary:     Effort: Pulmonary effort is normal. No respiratory distress.     Breath sounds: Normal breath sounds. No stridor. No wheezing, rhonchi or rales.  Chest:     Chest wall: No tenderness.  Skin:    General: Skin is warm and dry.  Neurological:     General: No focal deficit present.     Mental Status: She is alert and oriented to person, place, and time. Mental status is at baseline.  Psychiatric:        Mood and Affect: Mood normal.        Behavior: Behavior normal.        Thought Content: Thought content normal.        Judgment: Judgment normal.     No results found for any visits on 12/16/21.    The 10-year ASCVD risk score (Arnett DK, et al., 2019) is: 1.6%    Assessment & Plan:   Problem List Items Addressed This Visit   None Visit Diagnoses     Dry eyes    -  Primary   Relevant Orders   CBC with Differential/Platelet   Comprehensive metabolic panel   Hemoglobin A1c   Lipid panel    TSH   Urinalysis, Routine w reflex microscopic   Antinuclear Antib (ANA)   C-reactive protein   Sedimentation rate   B12 and Folate Panel   Vitamin D (25 hydroxy)   Dry mouth       Relevant Orders   CBC with Differential/Platelet   Comprehensive metabolic panel   Hemoglobin A1c   Lipid panel   TSH   Urinalysis, Routine w reflex microscopic   Antinuclear Antib (ANA)   C-reactive protein   Sedimentation rate   B12 and Folate Panel   Vitamin D (25 hydroxy)   Aches       Relevant Orders   CBC with Differential/Platelet   Comprehensive metabolic panel   Hemoglobin A1c   Lipid panel   TSH   Urinalysis, Routine w reflex microscopic   Antinuclear Antib (ANA)   C-reactive protein   Sedimentation rate   B12 and Folate Panel   Vitamin D (25 hydroxy)   Chronic fatigue       Relevant Orders   CBC with Differential/Platelet   Comprehensive metabolic panel   Hemoglobin A1c   Lipid panel   TSH   Urinalysis, Routine w reflex microscopic   Antinuclear Antib (ANA)   C-reactive protein   Sedimentation rate   B12 and Folate Panel   Vitamin D (25 hydroxy)   Paresthesias       Relevant Orders   CBC with Differential/Platelet   Comprehensive metabolic panel   Hemoglobin A1c   Lipid panel   TSH   Urinalysis, Routine w reflex microscopic   Antinuclear Antib (ANA)   C-reactive protein   Sedimentation rate   B12 and Folate Panel   Vitamin D (25 hydroxy)       No orders of the defined types were placed in this encounter.   Return if symptoms worsen or fail to improve.   PLAN Check on labs. Unsure of etiology. Sjogrens vs t2dm vs other  Will follow up as indicated Patient encouraged to call clinic with any questions, comments, or concerns.   Janeece Agee, NP

## 2021-12-17 LAB — C-REACTIVE PROTEIN: CRP: 1.4 mg/dL (ref 0.5–20.0)

## 2021-12-17 LAB — COMPREHENSIVE METABOLIC PANEL
ALT: 19 U/L (ref 0–35)
AST: 18 U/L (ref 0–37)
Albumin: 4 g/dL (ref 3.5–5.2)
Alkaline Phosphatase: 57 U/L (ref 39–117)
BUN: 14 mg/dL (ref 6–23)
CO2: 30 mEq/L (ref 19–32)
Calcium: 9.7 mg/dL (ref 8.4–10.5)
Chloride: 97 mEq/L (ref 96–112)
Creatinine, Ser: 0.68 mg/dL (ref 0.40–1.20)
GFR: 99.6 mL/min (ref >60.00)
Glucose, Bld: 80 mg/dL (ref 70–99)
Potassium: 4 mEq/L (ref 3.5–5.1)
Sodium: 135 mEq/L (ref 135–145)
Total Bilirubin: 0.2 mg/dL (ref 0.2–1.2)
Total Protein: 7 g/dL (ref 6.0–8.3)

## 2021-12-17 LAB — CBC WITH DIFFERENTIAL/PLATELET
Basophils Absolute: 0.1 10*3/uL (ref 0.0–0.1)
Basophils Relative: 1 % (ref 0.0–3.0)
Eosinophils Absolute: 0.3 10*3/uL (ref 0.0–0.7)
Eosinophils Relative: 2.7 % (ref 0.0–5.0)
HCT: 37.5 % (ref 36.0–46.0)
Hemoglobin: 12.3 g/dL (ref 12.0–15.0)
Lymphocytes Relative: 26.4 % (ref 12.0–46.0)
Lymphs Abs: 2.7 10*3/uL (ref 0.7–4.0)
MCHC: 32.7 g/dL (ref 30.0–36.0)
MCV: 80.4 fl (ref 78.0–100.0)
Monocytes Absolute: 0.6 10*3/uL (ref 0.1–1.0)
Monocytes Relative: 6.1 % (ref 3.0–12.0)
Neutro Abs: 6.5 10*3/uL (ref 1.4–7.7)
Neutrophils Relative %: 63.8 % (ref 43.0–77.0)
Platelets: 366 10*3/uL (ref 150.0–400.0)
RBC: 4.66 Mil/uL (ref 3.87–5.11)
RDW: 14.5 % (ref 11.5–15.5)
WBC: 10.2 10*3/uL (ref 4.0–10.5)

## 2021-12-17 LAB — URINALYSIS, ROUTINE W REFLEX MICROSCOPIC
Bilirubin Urine: NEGATIVE
Hgb urine dipstick: NEGATIVE
Ketones, ur: NEGATIVE
Leukocytes,Ua: NEGATIVE
Nitrite: NEGATIVE
Specific Gravity, Urine: 1.01 (ref 1.000–1.030)
Total Protein, Urine: NEGATIVE
Urine Glucose: NEGATIVE
Urobilinogen, UA: 0.2 (ref 0.0–1.0)
pH: 6 (ref 5.0–8.0)

## 2021-12-17 LAB — B12 AND FOLATE PANEL
Folate: 12.7 ng/mL (ref >5.9)
Vitamin B-12: 285 pg/mL (ref 211–911)

## 2021-12-17 LAB — LIPID PANEL
Cholesterol: 160 mg/dL (ref 0–200)
HDL: 49.9 mg/dL (ref >39.00)
LDL Cholesterol: 82 mg/dL (ref 0–99)
NonHDL: 110.32
Total CHOL/HDL Ratio: 3
Triglycerides: 144 mg/dL (ref 0.0–149.0)
VLDL: 28.8 mg/dL (ref 0.0–40.0)

## 2021-12-17 LAB — SEDIMENTATION RATE: Sed Rate: 52 mm/hr — ABNORMAL HIGH (ref 0–30)

## 2021-12-17 LAB — TSH: TSH: 1.88 u[IU]/mL (ref 0.35–5.50)

## 2021-12-17 LAB — VITAMIN D 25 HYDROXY (VIT D DEFICIENCY, FRACTURES): VITD: 14.45 ng/mL — ABNORMAL LOW (ref 30.00–100.00)

## 2021-12-17 LAB — HEMOGLOBIN A1C: Hgb A1c MFr Bld: 6.5 % (ref 4.6–6.5)

## 2021-12-18 ENCOUNTER — Other Ambulatory Visit: Payer: Self-pay | Admitting: Registered Nurse

## 2021-12-18 DIAGNOSIS — E559 Vitamin D deficiency, unspecified: Secondary | ICD-10-CM

## 2021-12-18 MED ORDER — VITAMIN D (ERGOCALCIFEROL) 1.25 MG (50000 UNIT) PO CAPS: 50000.0000 [IU] | ORAL_CAPSULE | ORAL | 1 refills | Status: DC

## 2021-12-19 LAB — ANA: Anti Nuclear Antibody (ANA): NEGATIVE

## 2022-01-07 ENCOUNTER — Emergency Department: Payer: BC Managed Care – PPO

## 2022-01-07 ENCOUNTER — Encounter: Payer: Self-pay | Admitting: Physician Assistant

## 2022-01-07 ENCOUNTER — Ambulatory Visit: Payer: BC Managed Care – PPO | Admitting: Physician Assistant

## 2022-01-07 ENCOUNTER — Other Ambulatory Visit: Payer: Self-pay

## 2022-01-07 ENCOUNTER — Encounter: Payer: Self-pay | Admitting: Intensive Care

## 2022-01-07 ENCOUNTER — Emergency Department
Admission: EM | Admit: 2022-01-07 | Discharge: 2022-01-07 | Disposition: A | Discharge status: Home / Self Care | Payer: BC Managed Care – PPO | Attending: Emergency Medicine | Admitting: Emergency Medicine

## 2022-01-07 VITALS — BP 110/80 | HR 86 | Temp 97.9°F | Resp 17 | Ht 64.0 in | Wt 308.0 lb

## 2022-01-07 DIAGNOSIS — R4182 Altered mental status, unspecified: Secondary | ICD-10-CM | POA: Diagnosis not present

## 2022-01-07 DIAGNOSIS — R202 Paresthesia of skin: Secondary | ICD-10-CM

## 2022-01-07 DIAGNOSIS — R29810 Facial weakness: Secondary | ICD-10-CM

## 2022-01-07 DIAGNOSIS — D75839 Thrombocytosis, unspecified: Secondary | ICD-10-CM | POA: Diagnosis not present

## 2022-01-07 DIAGNOSIS — R200 Anesthesia of skin: Secondary | ICD-10-CM | POA: Diagnosis not present

## 2022-01-07 DIAGNOSIS — G510 Bell's palsy: Secondary | ICD-10-CM | POA: Diagnosis not present

## 2022-01-07 DIAGNOSIS — R29818 Other symptoms and signs involving the nervous system: Secondary | ICD-10-CM | POA: Diagnosis not present

## 2022-01-07 DIAGNOSIS — R519 Headache, unspecified: Secondary | ICD-10-CM | POA: Diagnosis not present

## 2022-01-07 DIAGNOSIS — I10 Essential (primary) hypertension: Secondary | ICD-10-CM | POA: Diagnosis not present

## 2022-01-07 HISTORY — DX: Essential (primary) hypertension: I10

## 2022-01-07 LAB — DIFFERENTIAL
Abs Immature Granulocytes: 0.08 10*3/uL — ABNORMAL HIGH (ref 0.00–0.07)
Basophils Absolute: 0 10*3/uL (ref 0.0–0.1)
Basophils Relative: 0 %
Eosinophils Absolute: 0.3 10*3/uL (ref 0.0–0.5)
Eosinophils Relative: 3 %
Immature Granulocytes: 1 %
Lymphocytes Relative: 25 %
Lymphs Abs: 2.3 10*3/uL (ref 0.7–4.0)
Monocytes Absolute: 0.4 10*3/uL (ref 0.1–1.0)
Monocytes Relative: 5 %
Neutro Abs: 6 10*3/uL (ref 1.7–7.7)
Neutrophils Relative %: 66 %

## 2022-01-07 LAB — COMPREHENSIVE METABOLIC PANEL
ALT: 22 U/L (ref 0–44)
AST: 17 U/L (ref 15–41)
Albumin: 3.9 g/dL (ref 3.5–5.0)
Alkaline Phosphatase: 64 U/L (ref 38–126)
Anion gap: 7 (ref 5–15)
BUN: 17 mg/dL (ref 6–20)
CO2: 26 mmol/L (ref 22–32)
Calcium: 9.5 mg/dL (ref 8.9–10.3)
Chloride: 103 mmol/L (ref 98–111)
Creatinine, Ser: 0.65 mg/dL (ref 0.44–1.00)
GFR, Estimated: >60 mL/min (ref >60)
Glucose, Bld: 128 mg/dL — ABNORMAL HIGH (ref 70–99)
Potassium: 3.7 mmol/L (ref 3.5–5.1)
Sodium: 136 mmol/L (ref 135–145)
Total Bilirubin: 0.5 mg/dL (ref 0.3–1.2)
Total Protein: 7.8 g/dL (ref 6.5–8.1)

## 2022-01-07 LAB — CBC
HCT: 40.6 % (ref 36.0–46.0)
Hemoglobin: 12.8 g/dL (ref 12.0–15.0)
MCH: 26 pg (ref 26.0–34.0)
MCHC: 31.5 g/dL (ref 30.0–36.0)
MCV: 82.4 fL (ref 80.0–100.0)
Platelets: 411 10*3/uL — ABNORMAL HIGH (ref 150–400)
RBC: 4.93 MIL/uL (ref 3.87–5.11)
RDW: 14.2 % (ref 11.5–15.5)
WBC: 9.2 10*3/uL (ref 4.0–10.5)
nRBC: 0 % (ref 0.0–0.2)

## 2022-01-07 LAB — ETHANOL: Alcohol, Ethyl (B): <10 mg/dL (ref <10)

## 2022-01-07 LAB — PROTIME-INR
INR: 1 (ref 0.8–1.2)
Prothrombin Time: 13 seconds (ref 11.4–15.2)

## 2022-01-07 LAB — APTT: aPTT: 31 seconds (ref 24–36)

## 2022-01-07 MED ORDER — PREDNISONE 20 MG PO TABS: 60.0000 mg | ORAL_TABLET | Freq: Every day | ORAL | 0 refills | Status: DC

## 2022-01-07 MED ORDER — ACETAMINOPHEN 500 MG PO TABS
1000.0000 mg | ORAL_TABLET | Freq: Once | ORAL | Status: AC
  Administered 2022-01-07: 1000 mg via ORAL
  Filled 2022-01-07: qty 2

## 2022-01-07 MED ORDER — VALACYCLOVIR HCL 1 G PO TABS: 1000.0000 mg | ORAL_TABLET | Freq: Three times a day (TID) | ORAL | 0 refills | Status: AC

## 2022-01-07 MED ORDER — VALACYCLOVIR HCL 1 G PO TABS: 1000.0000 mg | ORAL_TABLET | Freq: Three times a day (TID) | ORAL | 0 refills | Status: DC

## 2022-01-07 MED ORDER — PREDNISONE 20 MG PO TABS: 60.0000 mg | ORAL_TABLET | Freq: Every day | ORAL | 0 refills | Status: AC

## 2022-01-07 MED ORDER — ERYTHROMYCIN 5 MG/GM OP OINT: 1.0000 | TOPICAL_OINTMENT | Freq: Every day | OPHTHALMIC | 0 refills | Status: AC

## 2022-01-07 MED ORDER — ERYTHROMYCIN 5 MG/GM OP OINT: 1.0000 | TOPICAL_OINTMENT | Freq: Every day | OPHTHALMIC | 0 refills | Status: DC

## 2022-01-07 NOTE — ED Notes (Signed)
Patient discharged at this time. Ambulated to lobby with independent and steady gait. Breathing unlabored speaking in full sentences. Verbalized understanding of all discharge, follow up, and medication teaching. Discharged homed with all belongings.   

## 2022-01-07 NOTE — ED Provider Notes (Signed)
[facility] Provider Note    Event Date/Time   First MD Initiated Contact with Patient 01/07/22 1516     (approximate)   History   Headache and Numbness   HPI  Jasmine Wu is a 51 y.o. female who comes in with concern for left-sided facial changes.  Patient is last known normal was at 9:30 PM last night and she woke up this morning with left-sided face tingling watery eye difficulty closing her left eye.  She still able to lift the left side of her forehead but does report it is harder to to lift that side than the right side..  She denies any numbness or tingling in her arms or legs.  She reports some watering to the left eye but still able to see out of the eye. Denies any tick bites.    Physical Exam   Triage Vital Signs: ED Triage Vitals  Enc Vitals Group     BP 01/07/22 1051 (!) 147/66     Pulse Rate 01/07/22 1051 85     Resp 01/07/22 1051 16     Temp 01/07/22 1051 98.4 F (36.9 C)     Temp Source 01/07/22 1051 Oral     SpO2 01/07/22 1051 94 %     Weight 01/07/22 1053 295 lb (133.8 kg)     Height 01/07/22 1053 5\' 4"  (1.626 m)     Head Circumference --      Peak Flow --      Pain Score 01/07/22 1052 6     Pain Loc --      Pain Edu? --      Excl. in GC? --     Most recent vital signs: Vitals:   01/07/22 1051 01/07/22 1443  BP: (!) 147/66 131/64  Pulse: 85 80  Resp: 16 16  Temp: 98.4 F (36.9 C) 98 F (36.7 C)  SpO2: 94% 95%     General: Awake, no distress.  CV:  Good peripheral perfusion.  Resp:  Normal effort.  Abd:  No distention.  Other:  Patient has facial droop noted on the left with some sensation changes on the left still able to close her left eye but decreased compared to the right.  She is able to lift up the left eyebrow but may be slightly decreased compared to the right No temporal artery tenderness. TM clear without any ramsay hunt   ED Results / Procedures / Treatments   Labs (all labs ordered are listed,  but only abnormal results are displayed) Labs Reviewed  CBC - Abnormal; Notable for the following components:      Result Value   Platelets 411 (*)    All other components within normal limits  DIFFERENTIAL - Abnormal; Notable for the following components:   Abs Immature Granulocytes 0.08 (*)    All other components within normal limits  COMPREHENSIVE METABOLIC PANEL - Abnormal; Notable for the following components:   Glucose, Bld 128 (*)    All other components within normal limits  PROTIME-INR  APTT  ETHANOL  CBG MONITORING, ED     EKG  My interpretation of EKG:  Normal sinus rate of 87 without any ST elevation to inversion lead III, normal intervals  RADIOLOGY I have reviewed the CT head personally interpreted no evidence of intracranial hemorrhage  PROCEDURES:  Critical Care performed: No  Procedures   MEDICATIONS ORDERED IN ED: Medications - No data to display   IMPRESSION / MDM / ASSESSMENT AND  PLAN / ED COURSE  I reviewed the triage vital signs and the nursing notes.   Patient's presentation is most consistent with acute presentation with potential threat to life or bodily function.   Suspect this is most likely Bell's palsy but given she is still able to use the left eyebrow consider possible upper cranial nerve V issue and we will get MRI to rule out any kind of stroke mass etc. she initially declined anything for headache but then later asked for some Tylenol.  No temporal artery pain to suggest temporal arteritis.  No evidence of Ramsay Hunt based upon examination.  No tick bites to suggest this being related to Lyme disease.  EtOH negative coags normal CBC shows slightly elevated platelets she can have this followed up with her primary doctor.  CMP reassuring CT head was negative  IMPRESSION: No acute intracranial abnormality. Mild white matter changes likely due to chronic microvascular ischemia in the frontal lobes.   Discussed the incidental  findings on MRI given her neurology's number for follow-up.  We discussed prednisone, valacyclovir, artificial eyedrops, erythromycin ointment at nighttime.  We discussed taping the eye shut if it is unable to be closed at this time she can still fully close it.  We discussed following up with [facility] for eye examination.  Patient expressed understanding felt comfortable with discharge home       FINAL CLINICAL IMPRESSION(S) / ED DIAGNOSES   Final diagnoses:  Bell's palsy     Rx / DC Orders   ED Discharge Orders          Ordered    erythromycin ophthalmic ointment  Daily at bedtime,   Status:  Discontinued        01/07/22 1804    predniSONE (DELTASONE) 20 MG tablet  Daily with breakfast,   Status:  Discontinued        01/07/22 1804    valACYclovir (VALTREX) 1000 MG tablet  3 times daily,   Status:  Discontinued        01/07/22 1804    erythromycin ophthalmic ointment  Daily at bedtime        01/07/22 1912    predniSONE (DELTASONE) 20 MG tablet  Daily with breakfast        01/07/22 1912    valACYclovir (VALTREX) 1000 MG tablet  3 times daily        01/07/22 1912             Note:  This document was prepared using Dragon voice recognition software and may include unintentional dictation errors.   Concha Se, MD 01/07/22 [phone]

## 2022-01-07 NOTE — Discharge Instructions (Addendum)
Take the steroids and antiviral medications and use artificial eyedrops every hour while awake and the erythromycin ointment at nighttime.  If the eye is unable to be shut you should tape the eye shut with some medical grade tape from [facility] or [facility].  You discussed with the pharmacist to help you get the right tape.  Please call the eye doctor to make a follow-up appointment to have your eye thoroughly examined to make sure no abrasion or ulceration is forming and please call the neurologist for a follow-up due to your MRI.  Your plts were slightly elevated and this can be followed up with primary doctor.   Return to the ER if you develop symptoms that are spreading anywhere else or any other concerns   [facility] follow up:  Address: [address] [place], [place] [address] Phone: [phone]  IMPRESSION: No acute intracranial abnormality. Mild white matter changes likely due to chronic microvascular ischemia in the frontal lobes.

## 2022-01-07 NOTE — ED Triage Notes (Signed)
Patient c/o headache X5 days. Reports going to bed at 9:30pm last night and waking up this AM with numbness on the left side of her face, twitching and watering left eye. Reports it is difficult to close her left eye.

## 2022-01-07 NOTE — Patient Instructions (Signed)
I discussed the vital signs with the patient. They are within normal limits. I discussed the exam findings with the patient. No "RED FLAGS" noted on exam. I expressed my concern for the continued headaches, blurring of vision, and difficulty of closing the left  eye. I strongly suggested the patient go directly to the Emergency Dept for additional evaluation and management.

## 2022-01-07 NOTE — Progress Notes (Signed)
Subjective:    Patient ID: Jasmine Wu, female    DOB: [birth year], 51 y.o.   MRN: [id number]  Patient is a 51 y/o female who present to clinic with c/o left face twitching, facial numbness and difficulty closing the left eye.  Patient states this started with a headache on 8/13. This was accompanied by twitching of the left eye. Today upon waking, the patient noted numbness and tingling of the left cheek. The noted problems closing the left eye. No reported facial weakness, or extremity weakness. No fever or vomiting. Some reported nausea. No difficulty speaking or swallowing. No difficulty with using upper or lower extremities. She notices twitching of the left eye and some blurring of her vision in the left eye. No SOB, chest pain or palpitations.      Review of Systems  Constitutional:  Negative for activity change, appetite change, chills, fatigue and fever.  HENT:  Positive for congestion, ear pain, facial swelling, postnasal drip and sinus pressure. Negative for drooling, hearing loss, mouth sores, sore throat, tinnitus and trouble swallowing.   Eyes:  Positive for visual disturbance. Negative for discharge and itching.       Left eye twitching. Blurring vision on the left.  Respiratory:  Negative for apnea, cough, chest tightness, shortness of breath and wheezing.   Cardiovascular:  Negative for chest pain, palpitations and leg swelling.  Gastrointestinal: Negative.   Genitourinary:  Negative for dysuria and hematuria.  Musculoskeletal:  Positive for arthralgias and gait problem. Negative for neck pain and neck stiffness.  Neurological:  Negative for seizures, syncope, speech difficulty, numbness and headaches.       Objective:   Physical Exam Constitutional:      Appearance: Normal appearance.  HENT:     Head: Normocephalic and atraumatic.     Right Ear: Tympanic membrane normal.     Left Ear: Tympanic membrane normal.     Nose: Nose normal.     Mouth/Throat:      Mouth: Mucous membranes are moist.     Comments: Raises soft pallet without problem. Tongue mid line. Speech clear. Eyes:     Extraocular Movements: Extraocular movements intact.     Pupils: Pupils are equal, round, and reactive to light.     Comments: Patient can not raise the left eyebrow as easily as  the right. Patient has difficulty closing the left eye.  Cardiovascular:     Rate and Rhythm: Normal rate.     Pulses: Normal pulses.  Pulmonary:     Effort: Pulmonary effort is normal.     Breath sounds: Normal breath sounds.  Musculoskeletal:        General: Tenderness and signs of injury present.     Cervical back: Normal range of motion and neck supple.  Neurological:     General: No focal deficit present.     Mental Status: She is alert and oriented to person, place, and time.     Comments: No extremity weakness. Gait steady without problem.  Psychiatric:        Thought Content: Thought content normal.     Comments: Patient anxious.           Assessment & Plan:  Paresthesia left face Hemifacial spasm  Plan:  I discussed the vital signs with the patient. They are within normal limits. I discussed the exam findings with the patient. No "RED FLAGS" noted on exam. I expressed my concern for the continued headaches, blurring of vision, and difficulty of closing  the left  eye. I strongly suggested the patient go directly to the Emergency Dept for additional evaluation and management. Patient is in agreement. We will follow up in the clinic.

## 2022-02-10 DIAGNOSIS — G510 Bell's palsy: Secondary | ICD-10-CM | POA: Diagnosis not present

## 2022-02-10 DIAGNOSIS — G245 Blepharospasm: Secondary | ICD-10-CM | POA: Diagnosis not present

## 2022-02-10 DIAGNOSIS — G479 Sleep disorder, unspecified: Secondary | ICD-10-CM | POA: Diagnosis not present

## 2022-02-10 DIAGNOSIS — G5139 Clonic hemifacial spasm, unspecified: Secondary | ICD-10-CM | POA: Diagnosis not present

## 2022-02-17 ENCOUNTER — Other Ambulatory Visit: Payer: Self-pay | Admitting: Family Medicine

## 2022-03-07 ENCOUNTER — Other Ambulatory Visit: Payer: Self-pay | Admitting: Registered Nurse

## 2022-03-07 ENCOUNTER — Other Ambulatory Visit: Payer: Self-pay | Admitting: Family Medicine

## 2022-03-07 DIAGNOSIS — F4323 Adjustment disorder with mixed anxiety and depressed mood: Secondary | ICD-10-CM

## 2022-03-07 MED ORDER — PAROXETINE HCL 30 MG PO TABS: 30.0000 mg | ORAL_TABLET | Freq: Every day | ORAL | 0 refills | Status: DC

## 2022-03-07 MED ORDER — LOSARTAN POTASSIUM-HCTZ 100-25 MG PO TABS: 1.0000 | ORAL_TABLET | Freq: Every day | ORAL | 0 refills | Status: DC

## 2022-03-07 NOTE — Telephone Encounter (Signed)
Patient called in asking for a refill of her Paxil. She is a former patient of [facility] and has a NP appointment next month and needs one month of the paxil to get her to that appt. I let her know that I would get that taken care of for her. She voiced understanding.

## 2022-03-28 ENCOUNTER — Encounter: Payer: Self-pay | Admitting: Family

## 2022-03-28 ENCOUNTER — Ambulatory Visit: Payer: BC Managed Care – PPO | Admitting: Family

## 2022-03-28 VITALS — BP 114/86 | HR 78 | Temp 98.2°F | Resp 16 | Ht 64.0 in | Wt 304.1 lb

## 2022-03-28 DIAGNOSIS — Z8261 Family history of arthritis: Secondary | ICD-10-CM | POA: Diagnosis not present

## 2022-03-28 DIAGNOSIS — E559 Vitamin D deficiency, unspecified: Secondary | ICD-10-CM

## 2022-03-28 DIAGNOSIS — E538 Deficiency of other specified B group vitamins: Secondary | ICD-10-CM | POA: Diagnosis not present

## 2022-03-28 DIAGNOSIS — R700 Elevated erythrocyte sedimentation rate: Secondary | ICD-10-CM | POA: Diagnosis not present

## 2022-03-28 DIAGNOSIS — N951 Menopausal and female climacteric states: Secondary | ICD-10-CM

## 2022-03-28 DIAGNOSIS — S01511A Laceration without foreign body of lip, initial encounter: Secondary | ICD-10-CM | POA: Insufficient documentation

## 2022-03-28 DIAGNOSIS — M2550 Pain in unspecified joint: Secondary | ICD-10-CM | POA: Diagnosis not present

## 2022-03-28 DIAGNOSIS — R739 Hyperglycemia, unspecified: Secondary | ICD-10-CM

## 2022-03-28 NOTE — Progress Notes (Signed)
Established Patient Office Visit  Subjective:  Patient ID: Jasmine Wu, female    DOB: 05/10/1971  Age: 51 y.o. MRN: [id number]  CC:  Chief Complaint  Patient presents with  . Establish Care    HPI Jasmine Wu is here for a transition of care visit.  Prior provider was: Maximiano Coss, NP  Pt is without acute concerns.   chronic concerns:  Vitamin d def: completed course of vitamin d not taking otc.   Saw neurologist for bells palsy, they let her know that they were order sleep study to evaluate for OSA.   Anxiety depression: previously on celexa but was having increased depression. Recently switched to paxil , does not see a therapist has in the past but not currently. Has been on lexapro as well in the past. MRI 01/07/22, mild white matter changes likely due to chronic microvascular ischemia in frontal lobes.   She does have bil knee, bil ankle stiffiness in am as well as wrist tightness. Stiffiness in am >1 hour. Dad with RA.      12/16/2021    2:36 PM 09/24/2020   11:30 AM 08/08/2020    3:46 PM  PHQ9 SCORE ONLY  PHQ-9 Total Score 7 0 0      03/28/2022    2:42 PM 02/21/2020   11:38 AM 09/13/2019    3:19 PM 07/19/2019    1:14 PM  GAD 7 : Generalized Anxiety Score  Nervous, Anxious, on Edge 0 0 0 1  Control/stop worrying 0 0 0 3  Worry too much - different things 1 0 0 3  Trouble relaxing 0 0 0 1  Restless 0 0 0 1  Easily annoyed or irritable 0 0 0 3  Afraid - awful might happen 0 0 0 0  Total GAD 7 Score 1 0 0 12  Anxiety Difficulty Somewhat difficult Not difficult at all Not difficult at all      Wt Readings from Last 3 Encounters:  03/28/22 (!) 304 lb 2 oz (138 kg)  01/07/22 295 lb (133.8 kg)  01/07/22 (!) 308 lb (139.7 kg)     Past Medical History:  Diagnosis Date  . Allergy   . Hypertension   . Lumbar radiculopathy, chronic 05/05/2019  . Spinal stenosis, lumbar region, with neurogenic claudication 04/08/2019    Past Surgical History:   Procedure Laterality Date  . ABDOMINAL HYSTERECTOMY  2019   one ovary left   . CESAREAN SECTION  2000  . CESAREAN SECTION  2003  . HAND SURGERY     capal tunnell  2012  right    Family History  Problem Relation Age of Onset  . Diabetes Father   . Heart disease Father   . Hypertension Father   . AAA (abdominal aortic aneurysm) Sister   . Cancer Maternal Grandfather   . Diabetes Paternal Grandfather   . Emphysema Paternal Grandfather     Social History   Socioeconomic History  . Marital status: Married    Spouse name: Not on file  . Number of children: 4  . Years of education: Not on file  . Highest education level: Not on file  Occupational History  . Not on file  Tobacco Use  . Smoking status: Never  . Smokeless tobacco: Never  Vaping Use  . Vaping Use: Never used  Substance and Sexual Activity  . Alcohol use: Not Currently  . Drug use: Not Currently  . Sexual activity: Yes    Birth control/protection: Surgical  Other Topics Concern  . Not on file  Social History Narrative  . Not on file   Social Determinants of Health   Financial Resource Strain: Not on file  Food Insecurity: Not on file  Transportation Needs: Not on file  Physical Activity: Not on file  Stress: Not on file  Social Connections: Not on file  Intimate Partner Violence: Not on file    Outpatient Medications Prior to Visit  Medication Sig Dispense Refill  . losartan-hydrochlorothiazide (HYZAAR) 100-25 MG tablet Take 1 tablet by mouth daily. 90 tablet 0  . pantoprazole (PROTONIX) 40 MG tablet TAKE 1 TABLET BY MOUTH EVERY DAY 90 tablet 0  . PARoxetine (PAXIL) 30 MG tablet Take 1 tablet (30 mg total) by mouth daily. 30 tablet 0  . Vitamin D, Ergocalciferol, (DRISDOL) 1.25 MG (50000 UNIT) CAPS capsule Take 1 capsule (50,000 Units total) by mouth every 7 (seven) days. 12 capsule 1  . azelastine (ASTELIN) 0.1 % nasal spray Place 1 spray into both nostrils 2 (two) times daily. Use in each nostril  as directed 30 mL 12  . diclofenac Sodium (VOLTAREN) 1 % GEL Apply 2 g topically 4 (four) times daily. 50 g 3   No facility-administered medications prior to visit.    No Known Allergies  ROS Review of Systems  Review of Systems  Respiratory:  Negative for shortness of breath.   Cardiovascular:  Negative for chest pain and palpitations.  Gastrointestinal:  Negative for constipation and diarrhea.  Genitourinary:  Negative for dysuria, frequency and urgency.  Musculoskeletal:  Negative for myalgias.  Psychiatric/Behavioral:  Negative for depression and suicidal ideas.   All other systems reviewed and are negative.    Objective:    Physical Exam  Gen: NAD, resting comfortably CV: RRR with no murmurs appreciated Pulm: NWOB, CTAB with no crackles, wheezes, or rhonchi Skin: warm, dry Psych: Normal affect and thought content  BP 114/86   Pulse 78   Temp 98.2 F (36.8 C)   Resp 16   Ht _0  (1.626 m)   Wt (!) 304 lb 2 oz (138 kg)   SpO2 96%   BMI 52.20 kg/m  Wt Readings from Last 3 Encounters:  03/28/22 (!) 304 lb 2 oz (138 kg)  01/07/22 295 lb (133.8 kg)  01/07/22 (!) 308 lb (139.7 kg)     Health Maintenance Due  Topic Date Due  . COVID-19 Vaccine (1) Never done  . PAP SMEAR-Modifier  Never done  . Zoster Vaccines- Shingrix (1 of 2) Never done  . MAMMOGRAM  07/10/2021    There are no preventive care reminders to display for this patient.  Lab Results  Component Value Date   TSH 1.88 12/16/2021   Lab Results  Component Value Date   WBC 9.2 01/07/2022   HGB 12.8 01/07/2022   HCT 40.6 01/07/2022   MCV 82.4 01/07/2022   PLT 411 (H) 01/07/2022   Lab Results  Component Value Date   NA 136 01/07/2022   K 3.7 01/07/2022   CO2 26 01/07/2022   GLUCOSE 128 (H) 01/07/2022   BUN 17 01/07/2022   CREATININE 0.65 01/07/2022   BILITOT 0.5 01/07/2022   ALKPHOS 64 01/07/2022   AST 17 01/07/2022   ALT 22 01/07/2022   PROT 7.8 01/07/2022   ALBUMIN 3.9  01/07/2022   CALCIUM 9.5 01/07/2022   ANIONGAP 7 01/07/2022   EGFR 108 12/12/2020   GFR 99.60 12/16/2021   Lab Results  Component Value Date   CHOL  160 12/16/2021   Lab Results  Component Value Date   HDL 49.90 12/16/2021   Lab Results  Component Value Date   LDLCALC 82 12/16/2021   Lab Results  Component Value Date   TRIG 144.0 12/16/2021   Lab Results  Component Value Date   CHOLHDL 3 12/16/2021   Lab Results  Component Value Date   HGBA1C 6.5 12/16/2021      Assessment & Plan:   Problem List Items Addressed This Visit   None   No orders of the defined types were placed in this encounter.   Follow-up: No follow-ups on file.    Eugenia Pancoast, FNP

## 2022-03-28 NOTE — Patient Instructions (Addendum)
Start otc vitamin d3 2000 IU once daily.    ------------------------------------ Recommend otc clotrimazole cream apply to lip twice daily. This will be for the corner of your lips.  ------------------------------------  Recommend you look into therapy as well for counseling.   ------------------------------------ Call neurology to set up follow up appointment with neurology, due about December   ------------------------------------   Welcome to our clinic, I am happy to have you as my new patient. I am excited to continue on this healthcare journey with you.  Stop by the lab prior to leaving today. I will notify you of your results once received.   Please keep in mind Any my chart messages you send have up to a three business day turnaround for a response.  Phone calls may take up to a one full business day turnaround for a  response.   If you need a medication refill I recommend you request it through the pharmacy as this is easiest for [place] rather than sending a message and or phone call.   Due to recent changes in healthcare laws, you may see results of your imaging and/or laboratory studies on MyChart before I have had a chance to review them.  I understand that in some cases there may be results that are confusing or concerning to you. Please understand that not all results are received at the same time and often I may need to interpret multiple results in order to provide you with the best plan of care or course of treatment. Therefore, I ask that you please give me 2 business days to thoroughly review all your results before contacting my office for clarification. Should we see a critical lab result, you will be contacted sooner.   It was a pleasure seeing you today! Please do not hesitate to reach out with any questions and or concerns.  Regards,   Eugenia Pancoast FNP-C

## 2022-03-29 LAB — VITAMIN D 25 HYDROXY (VIT D DEFICIENCY, FRACTURES): Vit D, 25-Hydroxy: 42 ng/mL (ref 30–100)

## 2022-03-29 LAB — HEMOGLOBIN A1C
Hgb A1c MFr Bld: 6.4 % of total Hgb — ABNORMAL HIGH (ref <5.7)
Mean Plasma Glucose: 137 mg/dL
eAG (mmol/L): 7.6 mmol/L

## 2022-03-29 LAB — TSH: TSH: 1.31 mIU/L

## 2022-03-29 LAB — SEDIMENTATION RATE: Sed Rate: 31 mm/h — ABNORMAL HIGH (ref 0–30)

## 2022-03-29 LAB — FOLLICLE STIMULATING HORMONE: FSH: 23.5 m[IU]/mL

## 2022-03-31 ENCOUNTER — Encounter: Payer: Self-pay | Admitting: Family

## 2022-03-31 DIAGNOSIS — E119 Type 2 diabetes mellitus without complications: Secondary | ICD-10-CM

## 2022-04-01 ENCOUNTER — Other Ambulatory Visit: Payer: Self-pay | Admitting: Family

## 2022-04-01 DIAGNOSIS — N951 Menopausal and female climacteric states: Secondary | ICD-10-CM | POA: Insufficient documentation

## 2022-04-01 DIAGNOSIS — M2550 Pain in unspecified joint: Secondary | ICD-10-CM | POA: Insufficient documentation

## 2022-04-01 DIAGNOSIS — E538 Deficiency of other specified B group vitamins: Secondary | ICD-10-CM | POA: Insufficient documentation

## 2022-04-01 DIAGNOSIS — E559 Vitamin D deficiency, unspecified: Secondary | ICD-10-CM | POA: Insufficient documentation

## 2022-04-01 DIAGNOSIS — N898 Other specified noninflammatory disorders of vagina: Secondary | ICD-10-CM

## 2022-04-01 DIAGNOSIS — R682 Dry mouth, unspecified: Secondary | ICD-10-CM

## 2022-04-01 DIAGNOSIS — R739 Hyperglycemia, unspecified: Secondary | ICD-10-CM | POA: Insufficient documentation

## 2022-04-01 LAB — WOUND CULTURE
MICRO NUMBER:: 14141608
SPECIMEN QUALITY:: ADEQUATE

## 2022-04-01 LAB — ANA: Anti Nuclear Antibody (ANA): NEGATIVE

## 2022-04-01 LAB — RHEUMATOID FACTOR: Rheumatoid fact SerPl-aCnc: <14 IU/mL (ref <14)

## 2022-04-01 LAB — PROLACTIN: Prolactin: 4.5 ng/mL

## 2022-04-01 NOTE — Assessment & Plan Note (Signed)
Ordered vitamin d pending results.   

## 2022-04-01 NOTE — Assessment & Plan Note (Signed)
Pt advised of the following: Work on a diabetic diet, try to incorporate exercise at least 20-30 a day for 3 days a week or more.   

## 2022-04-01 NOTE — Assessment & Plan Note (Signed)
Workup with ana rf and sed rate, pending results.

## 2022-04-01 NOTE — Assessment & Plan Note (Signed)
Vitamin b12 ordered pending results.

## 2022-04-01 NOTE — Assessment & Plan Note (Signed)
Wound culture today however suspected angular chelitis. Advised to start otc clotrimazole.

## 2022-04-01 NOTE — Assessment & Plan Note (Signed)
Lab workup in place pending results. 

## 2022-04-03 ENCOUNTER — Other Ambulatory Visit (INDEPENDENT_AMBULATORY_CARE_PROVIDER_SITE_OTHER): Payer: BC Managed Care – PPO

## 2022-04-03 DIAGNOSIS — N898 Other specified noninflammatory disorders of vagina: Secondary | ICD-10-CM

## 2022-04-03 DIAGNOSIS — R682 Dry mouth, unspecified: Secondary | ICD-10-CM

## 2022-04-03 MED ORDER — METFORMIN HCL 500 MG PO TABS: 500.0000 mg | ORAL_TABLET | Freq: Every day | ORAL | 1 refills | Status: DC

## 2022-04-04 LAB — SJOGREN'S SYNDROME ANTIBODS(SSA + SSB)
SSA (Ro) (ENA) Antibody, IgG: <1 AI
SSB (La) (ENA) Antibody, IgG: <1 AI

## 2022-04-18 ENCOUNTER — Other Ambulatory Visit: Payer: Self-pay | Admitting: Family

## 2022-04-18 ENCOUNTER — Other Ambulatory Visit: Payer: Self-pay | Admitting: Family Medicine

## 2022-04-18 DIAGNOSIS — F4323 Adjustment disorder with mixed anxiety and depressed mood: Secondary | ICD-10-CM

## 2022-04-22 MED ORDER — PANTOPRAZOLE SODIUM 40 MG PO TBEC: 40.0000 mg | DELAYED_RELEASE_TABLET | Freq: Every day | ORAL | 0 refills | Status: DC

## 2022-05-24 ENCOUNTER — Other Ambulatory Visit: Payer: Self-pay | Admitting: Family Medicine

## 2022-07-07 ENCOUNTER — Other Ambulatory Visit: Payer: Self-pay | Admitting: Family

## 2022-07-07 DIAGNOSIS — Z1231 Encounter for screening mammogram for malignant neoplasm of breast: Secondary | ICD-10-CM

## 2022-07-10 ENCOUNTER — Other Ambulatory Visit: Payer: Self-pay | Admitting: Family Medicine

## 2022-07-10 ENCOUNTER — Other Ambulatory Visit: Payer: Self-pay | Admitting: Family

## 2022-07-11 ENCOUNTER — Other Ambulatory Visit: Payer: Self-pay | Admitting: Family Medicine

## 2022-07-14 ENCOUNTER — Other Ambulatory Visit: Payer: Self-pay | Admitting: Family Medicine

## 2022-07-15 NOTE — Telephone Encounter (Signed)
Patient called to follow up on, she said it had been sent in a few times but it was sent to the wrong provider. Please advise

## 2022-07-15 NOTE — Addendum Note (Signed)
Addended by: Vaughan Browner on: 07/15/2022 10:30 AM   Modules accepted: Orders

## 2022-07-15 NOTE — Telephone Encounter (Signed)
Last visit- 03/28/22; Next visit- not scheduled; Last refill- 03/07/22 90tabs/no refills. I didn't see where you have filled this medication. Ok to send meds?

## 2022-07-16 ENCOUNTER — Telehealth: Payer: Self-pay | Admitting: Family

## 2022-07-16 NOTE — Telephone Encounter (Signed)
Patient called to follow up on this because she takes her last pill today. Please advise

## 2022-07-16 NOTE — Telephone Encounter (Signed)
Prescription Request  07/16/2022  Is this a "Controlled Substance" medicine? No  LOV: 03/28/2022  What is the name of the medication or equipment?  losartan-hydrochlorothiazide losartan-hydrochlorothiazide (HYZAAR) 100-25 MG tablet  Have you contacted your pharmacy to request a refill? Yes   Which pharmacy would you like this sent to?  [facility], [address] [place] [address] Phone: [phone] Fax: [phone]     Patient notified that their request is being sent to the clinical staff for review and that they should receive a response within 2 business days.   Please advise at Mobile [phone] (mobile)

## 2022-07-16 NOTE — Telephone Encounter (Signed)
Patient has scheduled an appt to f/u on 07/25/22 , but is unable to come in sooner because of her work schedule. She is completely out of medication and wants to see if we can send some in until she is seen.

## 2022-07-16 NOTE — Telephone Encounter (Signed)
Called and left a detailed message (DPR) advising to schedule an OV before her medications can be sent.

## 2022-07-16 NOTE — Addendum Note (Signed)
Addended by: Vaughan Browner on: 07/16/2022 04:18 PM   Modules accepted: Orders

## 2022-07-16 NOTE — Telephone Encounter (Signed)
See other message

## 2022-07-16 NOTE — Telephone Encounter (Signed)
Pt called back returning Shannon's missed call regarding scheduling an appointment before meds could be refilled. Pt stated she scheduled a ov scheduled next Fri, 3/1 with D[facility]. Pt states she cannot come in sooner due to work schedule & its currently out of meds. Call back # [phone]

## 2022-07-17 MED ORDER — LOSARTAN POTASSIUM-HCTZ 100-25 MG PO TABS: 1.0000 | ORAL_TABLET | Freq: Every day | ORAL | 0 refills | Status: DC

## 2022-07-17 NOTE — Telephone Encounter (Signed)
Left message(per DPR) advising the meds was sent in.

## 2022-07-23 ENCOUNTER — Telehealth: Payer: Self-pay | Admitting: Family

## 2022-07-25 ENCOUNTER — Ambulatory Visit: Payer: BC Managed Care – PPO | Admitting: Family

## 2022-07-29 ENCOUNTER — Ambulatory Visit: Payer: BC Managed Care – PPO | Admitting: Physician Assistant

## 2022-07-29 ENCOUNTER — Telehealth: Payer: BC Managed Care – PPO | Admitting: Physician Assistant

## 2022-07-29 DIAGNOSIS — J019 Acute sinusitis, unspecified: Secondary | ICD-10-CM

## 2022-07-29 DIAGNOSIS — B9689 Other specified bacterial agents as the cause of diseases classified elsewhere: Secondary | ICD-10-CM

## 2022-07-29 MED ORDER — FLUTICASONE PROPIONATE 50 MCG/ACT NA SUSP: 2.0000 | Freq: Every day | NASAL | 0 refills | Status: DC

## 2022-07-29 MED ORDER — PREDNISONE 20 MG PO TABS: 40.0000 mg | ORAL_TABLET | Freq: Every day | ORAL | 0 refills | Status: AC

## 2022-07-29 MED ORDER — AMOXICILLIN-POT CLAVULANATE 875-125 MG PO TABS: 1.0000 | ORAL_TABLET | Freq: Two times a day (BID) | ORAL | 0 refills | Status: DC

## 2022-07-29 NOTE — Progress Notes (Signed)
Virtual Visit Consent   Jasmine Wu, you are scheduled for a virtual visit with a [facility] provider today. Just as with appointments in the office, your consent must be obtained to participate. Your consent will be active for this visit and any virtual visit you may have with one of our providers in the next 365 days. If you have a MyChart account, a copy of this consent can be sent to you electronically.  As this is a virtual visit, video technology does not allow for your provider to perform a traditional examination. This may limit your provider's ability to fully assess your condition. If your provider identifies any concerns that need to be evaluated in person or the need to arrange testing (such as labs, EKG, etc.), we will make arrangements to do so. Although advances in technology are sophisticated, we cannot ensure that it will always work on either your end or our end. If the connection with a video visit is poor, the visit may have to be switched to a telephone visit. With either a video or telephone visit, we are not always able to ensure that we have a secure connection.  By engaging in this virtual visit, you consent to the provision of healthcare and authorize for your insurance to be billed (if applicable) for the services provided during this visit. Depending on your insurance coverage, you may receive a charge related to this service.  I need to obtain your verbal consent now. Are you willing to proceed with your visit today? Jasmine Wu has provided verbal consent on 07/29/2022 for a virtual visit (video or telephone). [name], [place]  Date: 07/29/2022 12:57 PM  Virtual Visit via Video Note   I, [name], connected with  Jasmine Wu  ([id number], [birth year]) on 07/29/22 at  1:15 PM EST by a video-enabled telemedicine application and verified that I am speaking with the correct person using two identifiers.  Location: Patient: Virtual Visit  Location Patient: Home Provider: Virtual Visit Location Provider: Home Office   I discussed the limitations of evaluation and management by telemedicine and the availability of in person appointments. The patient expressed understanding and agreed to proceed.    History of Present Illness: Jasmine Wu is a 52 y.o. who identifies as a female who was assigned female at birth, and is being seen today for 1 week of frontal headache associated with nasal congestion, sinus pressure and pain with thick discharge. Also associated with a dry cough.  Today with L ear pain that is constant. Scratchy throat. Denies fever, aches.  Denies recent travel or sick contact. OTC -- Ibuprofen.      HPI: HPI  Problems:  Patient Active Problem List   Diagnosis Date Noted   Vitamin D deficiency 04/01/2022   Polyarthralgia 04/01/2022   Hyperglycemia 04/01/2022   Hot flashes due to menopause 04/01/2022   Low serum vitamin B12 04/01/2022   Laceration of lip without foreign body 03/28/2022   Dermatitis 10/03/2019   Essential hypertension 07/19/2019   Adjustment disorder with mixed anxiety and depressed mood 07/19/2019   Lumbar radiculopathy, chronic 05/05/2019    Allergies: No Known Allergies Medications:  Current Outpatient Medications:    amoxicillin-clavulanate (AUGMENTIN) 875-125 MG tablet, Take 1 tablet by mouth 2 (two) times daily., Disp: 14 tablet, Rfl: 0   fluticasone (FLONASE) 50 MCG/ACT nasal spray, Place 2 sprays into both nostrils daily., Disp: 16 g, Rfl: 0   predniSONE (DELTASONE) 20 MG tablet, Take 2 tablets (40 mg total)  by mouth daily with breakfast for 3 days., Disp: 6 tablet, Rfl: 0   losartan-hydrochlorothiazide (HYZAAR) 100-25 MG tablet, Take 1 tablet by mouth daily., Disp: 30 tablet, Rfl: 0   metFORMIN (GLUCOPHAGE) 500 MG tablet, Take 1 tablet (500 mg total) by mouth daily with breakfast., Disp: 90 tablet, Rfl: 1   pantoprazole (PROTONIX) 40 MG tablet, TAKE 1 TABLET BY MOUTH DAILY,  Disp: 90 tablet, Rfl: 0   PARoxetine (PAXIL) 30 MG tablet, TAKE 1 TABLET BY MOUTH DAILY, Disp: 90 tablet, Rfl: 1  Observations/Objective: Patient is well-developed, well-nourished in no acute distress.  Resting comfortably at home.  Head is normocephalic, atraumatic.  No labored breathing. Speech is clear and coherent with logical content.  Patient is alert and oriented at baseline.   Assessment and Plan: 1. Acute bacterial sinusitis - fluticasone (FLONASE) 50 MCG/ACT nasal spray; Place 2 sprays into both nostrils daily.  Dispense: 16 g; Refill: 0 - amoxicillin-clavulanate (AUGMENTIN) 875-125 MG tablet; Take 1 tablet by mouth 2 (two) times daily.  Dispense: 14 tablet; Refill: 0 - predniSONE (DELTASONE) 20 MG tablet; Take 2 tablets (40 mg total) by mouth daily with breakfast for 3 days.  Dispense: 6 tablet; Refill: 0  Rx Augmentin.  Increase fluids.  Rest.  Saline nasal spray.  Probiotic.  Mucinex as directed.  Humidifier in bedroom. Flonase and prednisone per orders.  Call or return to clinic if symptoms are not improving.   Follow Up Instructions: I discussed the assessment and treatment plan with the patient. The patient was provided an opportunity to ask questions and all were answered. The patient agreed with the plan and demonstrated an understanding of the instructions.  A copy of instructions were sent to the patient via MyChart unless otherwise noted below.   The patient was advised to call back or seek an in-person evaluation if the symptoms worsen or if the condition fails to improve as anticipated.  Time:  I spent 10 minutes with the patient via telehealth technology discussing the above problems/concerns.    [name], PA-C

## 2022-07-29 NOTE — Patient Instructions (Signed)
Jasmine Wu, thank you for joining Jasmine Rio, PA-C for today's virtual visit.  While this provider is not your primary care provider (PCP), if your PCP is located in our provider database this encounter information will be shared with them immediately following your visit.   [facility] account gives you access to today's visit and all your visits, tests, and labs performed at [facility] " click here if you don't have a [facility] account or go to [email or web address]  Consent: (Patient) Jasmine Wu provided verbal consent for this virtual visit at the beginning of the encounter.  Current Medications:  Current Outpatient Medications:    losartan-hydrochlorothiazide (HYZAAR) 100-25 MG tablet, Take 1 tablet by mouth daily., Disp: 30 tablet, Rfl: 0   metFORMIN (GLUCOPHAGE) 500 MG tablet, Take 1 tablet (500 mg total) by mouth daily with breakfast., Disp: 90 tablet, Rfl: 1   pantoprazole (PROTONIX) 40 MG tablet, TAKE 1 TABLET BY MOUTH DAILY, Disp: 90 tablet, Rfl: 0   PARoxetine (PAXIL) 30 MG tablet, TAKE 1 TABLET BY MOUTH DAILY, Disp: 90 tablet, Rfl: 1   Medications ordered in this encounter:  No orders of the defined types were placed in this encounter.    *If you need refills on other medications prior to your next appointment, please contact your pharmacy*  Follow-Up: Call back or seek an in-person evaluation if the symptoms worsen or if the condition fails to improve as anticipated.  [place] [phone]  Other Instructions Please take antibiotic as directed.  Increase fluid intake.  Use Saline nasal spray.  Take a daily multivitamin. Use the Flonase and prednisone as directed.  Place a humidifier in the bedroom.  Please call or return clinic if symptoms are not improving.  Sinusitis Sinusitis is redness, soreness, and swelling (inflammation) of the paranasal sinuses. Paranasal sinuses are air pockets within  the bones of your face (beneath the eyes, the middle of the forehead, or above the eyes). In healthy paranasal sinuses, mucus is able to drain out, and air is able to circulate through them by way of your nose. However, when your paranasal sinuses are inflamed, mucus and air can become trapped. This can allow bacteria and other germs to grow and cause infection. Sinusitis can develop quickly and last only a short time (acute) or continue over a long period (chronic). Sinusitis that lasts for more than 12 weeks is considered chronic.  CAUSES  Causes of sinusitis include: Allergies. Structural abnormalities, such as displacement of the cartilage that separates your nostrils (deviated septum), which can decrease the air flow through your nose and sinuses and affect sinus drainage. Functional abnormalities, such as when the small hairs (cilia) that line your sinuses and help remove mucus do not work properly or are not present. SYMPTOMS  Symptoms of acute and chronic sinusitis are the same. The primary symptoms are pain and pressure around the affected sinuses. Other symptoms include: Upper toothache. Earache. Headache. Bad breath. Decreased sense of smell and taste. A cough, which worsens when you are lying flat. Fatigue. Fever. Thick drainage from your nose, which often is green and may contain pus (purulent). Swelling and warmth over the affected sinuses. DIAGNOSIS  Your caregiver will perform a physical exam. During the exam, your caregiver may: Look in your nose for signs of abnormal growths in your nostrils (nasal polyps). Tap over the affected sinus to check for signs of infection. View the inside of your sinuses (endoscopy) with a special imaging device  with a light attached (endoscope), which is inserted into your sinuses. If your caregiver suspects that you have chronic sinusitis, one or more of the following tests may be recommended: Allergy tests. Nasal culture A sample of mucus is  taken from your nose and sent to a lab and screened for bacteria. Nasal cytology A sample of mucus is taken from your nose and examined by your caregiver to determine if your sinusitis is related to an allergy. TREATMENT  Most cases of acute sinusitis are related to a viral infection and will resolve on their own within 10 days. Sometimes medicines are prescribed to help relieve symptoms (pain medicine, decongestants, nasal steroid sprays, or saline sprays).  However, for sinusitis related to a bacterial infection, your caregiver will prescribe antibiotic medicines. These are medicines that will help kill the bacteria causing the infection.  Rarely, sinusitis is caused by a fungal infection. In theses cases, your caregiver will prescribe antifungal medicine. For some cases of chronic sinusitis, surgery is needed. Generally, these are cases in which sinusitis recurs more than 3 times per year, despite other treatments. HOME CARE INSTRUCTIONS  Drink plenty of water. Water helps thin the mucus so your sinuses can drain more easily. Use a humidifier. Inhale steam 3 to 4 times a day (for example, sit in the bathroom with the shower running). Apply a warm, moist washcloth to your face 3 to 4 times a day, or as directed by your caregiver. Use saline nasal sprays to help moisten and clean your sinuses. Take over-the-counter or prescription medicines for pain, discomfort, or fever only as directed by your caregiver. SEEK IMMEDIATE MEDICAL CARE IF: You have increasing pain or severe headaches. You have nausea, vomiting, or drowsiness. You have swelling around your face. You have vision problems. You have a stiff neck. You have difficulty breathing. MAKE SURE YOU:  Understand these instructions. Will watch your condition. Will get help right away if you are not doing well or get worse. Document Released: 05/12/2005 Document Revised: 08/04/2011 Document Reviewed: 05/27/2011 [facility] Patient  Information 2014 [place], [place].    If you have been instructed to have an in-person evaluation today at a local Urgent Care facility, please use the link below. It will take you to a list of all of our available [facility], including address, phone number and hours of operation. Please do not delay care.  [facility]  If you or a family member do not have a primary care provider, use the link below to schedule a visit and establish care. When you choose a  primary care physician or advanced practice provider, you gain a long-term partner in health. Find a Primary Care Provider  Learn more about 's in-office and virtual care options: [facility]

## 2022-07-31 ENCOUNTER — Encounter: Payer: Self-pay | Admitting: Nurse Practitioner

## 2022-07-31 ENCOUNTER — Ambulatory Visit: Payer: BC Managed Care – PPO | Admitting: Family

## 2022-07-31 ENCOUNTER — Telehealth (INDEPENDENT_AMBULATORY_CARE_PROVIDER_SITE_OTHER): Payer: BC Managed Care – PPO | Admitting: Nurse Practitioner

## 2022-07-31 VITALS — Temp 99.0°F | Ht 64.0 in | Wt 304.1 lb

## 2022-07-31 DIAGNOSIS — U071 COVID-19: Secondary | ICD-10-CM | POA: Diagnosis not present

## 2022-07-31 MED ORDER — NIRMATRELVIR/RITONAVIR (PAXLOVID)TABLET: 3.0000 | ORAL_TABLET | Freq: Two times a day (BID) | ORAL | 0 refills | Status: AC

## 2022-07-31 NOTE — Patient Instructions (Signed)
Nice to see you today. You need to quarantine through 08/04/2022.  If you are fever free for 24 hours without the use of fever reducing medication and are improving with your symptoms you may come out of quarantine on 08/05/2022.  It is recommended you wear a mask for 5 days while around others that is all the way through 08/10/2022.

## 2022-07-31 NOTE — Assessment & Plan Note (Signed)
Positive for COVID-19 at home.  Did discuss antiviral treatments that they are EUA only.  After joint discussion did elect to use Paxlovid.  Patient will not use fluticasone nasal spray while on Paxlovid.  Did discuss signs and symptoms when to be seen urgent or emergently.  Did discuss common side effects of antiviral use.  Patient was given a digital work note.  She will follow-up if no improvement. Patient's risk factors include body habitus, hypertension, age.

## 2022-07-31 NOTE — Progress Notes (Signed)
Patient ID: Jasmine Wu, female    DOB: November 28, 1970, 52 y.o.   MRN: [id number]  Virtual visit completed through [place], a video enabled telemedicine application. Due to national recommendations of social distancing due to COVID-19, a virtual visit is felt to be most appropriate for this patient at this time. Reviewed limitations, risks, security and privacy concerns of performing a virtual visit and the availability of in person appointments. I also reviewed that there may be a patient responsible charge related to this service. The patient agreed to proceed.   Patient location: home Provider location: [place] at [facility] office Persons participating in this virtual visit: patient, provider   If any vitals were documented, they were collected by patient at home unless specified below.    Temp 99 F (37.2 C)   Ht '5\' 4"'$  (1.626 m)   Wt (!) 304 lb 2 oz (138 kg)   BMI 52.20 kg/m    CC: Covid 19 Subjective:   HPI: Jasmine Wu is a 52 y.o. female presenting on 07/31/2022 for Covid Positive (07/29/2021 ), Cough, Headache, and Sore Throat    Symptoms started on yesterday  She was seen by video visit for a sinus infection with symptoms that started a week ago on 07/29/2022. She was given augmentin, prednisone, fluticasone.  Patient states different new symptoms started last night.  With fever and cough and headache. Covid positive on 07/30/2022 Covid vaccine:original moderna Flu vaccine: UTD No sick exposure Granddaughter in the hospital  Ibuprofen and Tylenol for the fever.    Relevant past medical, surgical, family and social history reviewed and updated as indicated. Interim medical history since our last visit reviewed. Allergies and medications reviewed and updated. Outpatient Medications Prior to Visit  Medication Sig Dispense Refill   amoxicillin-clavulanate (AUGMENTIN) 875-125 MG tablet Take 1 tablet by mouth 2 (two) times daily. 14 tablet 0   fluticasone  (FLONASE) 50 MCG/ACT nasal spray Place 2 sprays into both nostrils daily. 16 g 0   losartan-hydrochlorothiazide (HYZAAR) 100-25 MG tablet Take 1 tablet by mouth daily. 30 tablet 0   metFORMIN (GLUCOPHAGE) 500 MG tablet Take 1 tablet (500 mg total) by mouth daily with breakfast. 90 tablet 1   pantoprazole (PROTONIX) 40 MG tablet TAKE 1 TABLET BY MOUTH DAILY 90 tablet 0   PARoxetine (PAXIL) 30 MG tablet TAKE 1 TABLET BY MOUTH DAILY 90 tablet 1   predniSONE (DELTASONE) 20 MG tablet Take 2 tablets (40 mg total) by mouth daily with breakfast for 3 days. 6 tablet 0   No facility-administered medications prior to visit.     Per HPI unless specifically indicated in ROS section below Review of Systems  Constitutional:  Positive for appetite change, chills, fatigue and fever.       Fluid intake was good   HENT:  Positive for ear discharge (feel full) and sinus pain. Negative for sore throat.   Respiratory:  Positive for cough. Negative for shortness of breath.   Cardiovascular:  Negative for chest pain.  Gastrointestinal:  Negative for abdominal pain, constipation, diarrhea and nausea.  Musculoskeletal:  Negative for arthralgias and myalgias.  Neurological:  Positive for headaches.   Objective:  Temp 99 F (37.2 C)   Ht '5\' 4"'$  (1.626 m)   Wt (!) 304 lb 2 oz (138 kg)   BMI 52.20 kg/m   Wt Readings from Last 3 Encounters:  07/31/22 (!) 304 lb 2 oz (138 kg)  03/28/22 (!) 304 lb 2 oz (138 kg)  01/07/22  295 lb (133.8 kg)       Physical exam: Gen: alert, NAD, not ill appearing Pulm: speaks in complete sentences without increased work of breathing Psych: normal mood, normal thought content      Results for orders placed or performed in visit on 04/03/22  Sjogren's syndrome antibods(ssa + ssb)  Result Value Ref Range   SSA (Ro) (ENA) Antibody, IgG <1.0 NEG <1.0 NEG AI   SSB ([place]) (ENA) Antibody, IgG <1.0 NEG <1.0 NEG AI   Assessment & Plan:   COVID-19 Assessment & Plan: Positive for  COVID-19 at home.  Did discuss antiviral treatments that they are EUA only.  After joint discussion did elect to use Paxlovid.  Patient will not use fluticasone nasal spray while on Paxlovid.  Did discuss signs and symptoms when to be seen urgent or emergently.  Did discuss common side effects of antiviral use.  Patient was given a digital work note.  She will follow-up if no improvement. Patient's risk factors include body habitus, hypertension, age.  Orders: -     nirmatrelvir/ritonavir; Take 3 tablets by mouth 2 (two) times daily for 5 days. (Take nirmatrelvir 150 mg two tablets twice daily for 5 days and ritonavir 100 mg one tablet twice daily for 5 days) Patient GFR is >60  Dispense: 30 tablet; Refill: 0     I discussed the assessment and treatment plan with the patient. The patient was provided an opportunity to ask questions and all were answered. The patient agreed with the plan and demonstrated an understanding of the instructions. The patient was advised to call back or seek an in-person evaluation if the symptoms worsen or if the condition fails to improve as anticipated.  Follow up plan: Return if symptoms worsen or fail to improve.  Romilda Garret, NP

## 2022-08-07 ENCOUNTER — Encounter: Payer: Self-pay | Admitting: Family

## 2022-08-07 ENCOUNTER — Encounter: Payer: Self-pay | Admitting: *Deleted

## 2022-08-07 ENCOUNTER — Telehealth (INDEPENDENT_AMBULATORY_CARE_PROVIDER_SITE_OTHER): Payer: BC Managed Care – PPO | Admitting: Family

## 2022-08-07 VITALS — Ht 64.0 in | Wt 304.0 lb

## 2022-08-07 DIAGNOSIS — F419 Anxiety disorder, unspecified: Secondary | ICD-10-CM

## 2022-08-07 DIAGNOSIS — U071 COVID-19: Secondary | ICD-10-CM | POA: Diagnosis not present

## 2022-08-07 DIAGNOSIS — F32A Depression, unspecified: Secondary | ICD-10-CM

## 2022-08-07 DIAGNOSIS — F411 Generalized anxiety disorder: Secondary | ICD-10-CM

## 2022-08-07 DIAGNOSIS — F4329 Adjustment disorder with other symptoms: Secondary | ICD-10-CM

## 2022-08-07 DIAGNOSIS — F4323 Adjustment disorder with mixed anxiety and depressed mood: Secondary | ICD-10-CM

## 2022-08-07 MED ORDER — FLUOXETINE HCL 20 MG PO TABS: 20.0000 mg | ORAL_TABLET | Freq: Every day | ORAL | 0 refills | Status: DC

## 2022-08-07 MED ORDER — HYDROXYZINE PAMOATE 25 MG PO CAPS: 25.0000 mg | ORAL_CAPSULE | Freq: Three times a day (TID) | ORAL | 0 refills | Status: DC | PRN

## 2022-08-07 NOTE — Assessment & Plan Note (Signed)
Referral placed for therapist  Stop paxil start prozac 20 mg rx sent in  Hydroxyxine prn  Work on anxiety reducing techniques Will start FMLA awaiting paperwork

## 2022-08-07 NOTE — Assessment & Plan Note (Signed)
improving

## 2022-08-07 NOTE — Patient Instructions (Addendum)
  [facility] , recommendation is for [place] , [place] [address]  T: [phone]  Call them to see if they take your insurance  Stop paxil, start prozac 20 mg once daily.    Regards,   Eugenia Pancoast FNP-C

## 2022-08-07 NOTE — Progress Notes (Signed)
Virtual Visit via Video note  I connected with Jasmine Wu on 08/07/22 by video and verified that I am speaking with the correct person using two identifiers. Jasmine Wu is currently located at home. The provider, Eugenia Pancoast, FNP is located in their office at time of visit.  I discussed the limitations, risks, security and privacy concerns of performing an evaluation and management service by video and the availability of in person appointments. I also discussed with the patient that there may be a patient responsible charge related to this service. The patient expressed understanding and agreed to proceed.  Subjective: PCP: Eugenia Pancoast, FNP  Chief Complaint  Patient presents with   Panic Attack    HPI   Granddaughter was born about two weeks not breathing , but she did survive and she was placed in the NICU. They think she might have brain damage. She finds she is really struggling with all of this, trying to get back to work and she is unable to do this at this time. 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 On top of everything else she had covid. She is crying often, and hard to control. She finds she is very emotional. She   She does take paxil 30 mg once daily but she doesn't think it has been helping for a while. She has been on lexapro in the past which she doesn't remember if it helped or not.   Finding it hard to focus, talk without being emotional, experiencing panic attacks which don't allow her to focus or concentrate.   FMLA to be filled out Start date: 3/14  End date: 3/25           ROS: Per HPI  Current Outpatient Medications:    FLUoxetine (PROZAC) 20 MG tablet, Take 1 tablet (20 mg total) by mouth daily., Disp: 90 tablet, Rfl: 0   hydrOXYzine (VISTARIL) 25 MG capsule, Take 1 capsule (25 mg total) by mouth every 8 (eight) hours as needed for anxiety., Disp: 30 capsule, Rfl: 0   losartan-hydrochlorothiazide (HYZAAR) 100-25 MG  tablet, Take 1 tablet by mouth daily., Disp: 30 tablet, Rfl: 0   metFORMIN (GLUCOPHAGE) 500 MG tablet, Take 1 tablet (500 mg total) by mouth daily with breakfast., Disp: 90 tablet, Rfl: 1   pantoprazole (PROTONIX) 40 MG tablet, TAKE 1 TABLET BY MOUTH DAILY, Disp: 90 tablet, Rfl: 0  Observations/Objective: Physical Exam Constitutional:      General: She is not in acute distress.    Appearance: Normal appearance. She is obese. She is not ill-appearing, toxic-appearing or diaphoretic.  HENT:     Head: Normocephalic.  Cardiovascular:     Rate and Rhythm: Normal rate.  Pulmonary:     Effort: Pulmonary effort is normal.  Musculoskeletal:        General: Normal range of motion.  Neurological:     General: No focal deficit present.     Mental Status: She is alert and oriented to person, place, and time. Mental status is at baseline.  Psychiatric:        Mood and Affect: Mood is anxious and depressed.        Speech: Speech normal.        Behavior: Behavior normal.        Thought Content: Thought content normal.        Judgment: Judgment normal.     Assessment and Plan: Anxiety state -     FLUoxetine HCl; Take 1 tablet (20 mg  total) by mouth daily.  Dispense: 90 tablet; Refill: 0 -     Ambulatory referral to Psychology -     hydrOXYzine Pamoate; Take 1 capsule (25 mg total) by mouth every 8 (eight) hours as needed for anxiety.  Dispense: 30 capsule; Refill: 0  Adjustment disorder with disturbance of emotion -     FLUoxetine HCl; Take 1 tablet (20 mg total) by mouth daily.  Dispense: 90 tablet; Refill: 0 -     Ambulatory referral to Psychology  Anxiety and depression -     FLUoxetine HCl; Take 1 tablet (20 mg total) by mouth daily.  Dispense: 90 tablet; Refill: 0 -     Ambulatory referral to Psychology  COVID-19 Assessment & Plan: improving   Adjustment disorder with mixed anxiety and depressed mood Assessment & Plan: Referral placed for therapist  Stop paxil start prozac 20  mg rx sent in  Hydroxyxine prn  Work on anxiety reducing techniques Will start FMLA awaiting paperwork      Follow Up Instructions: Return in about 8 days (around 08/15/2022) for f/u anxiety can be video visit .   I discussed the assessment and treatment plan with the patient. The patient was provided an opportunity to ask questions and all were answered. The patient agreed with the plan and demonstrated an understanding of the instructions.   The patient was advised to call back or seek an in-person evaluation if the symptoms worsen or if the condition fails to improve as anticipated.  The above assessment and management plan was discussed with the patient. The patient verbalized understanding of and has agreed to the management plan. Patient is aware to call the clinic if symptoms persist or worsen. Patient is aware when to return to the clinic for a follow-up visit. Patient educated on when it is appropriate to go to the emergency department.   Time call ended: 1054  I provided 14 minutes of face-to-face time during this encounter.   Eugenia Pancoast, MSN, APRN, FNP-C [place]

## 2022-08-08 ENCOUNTER — Telehealth: Payer: Self-pay | Admitting: Family

## 2022-08-08 NOTE — Telephone Encounter (Signed)
Forms received and placed in Jasmine Wu's scan box in her office.

## 2022-08-08 NOTE — Telephone Encounter (Signed)
Pt called stating she was going to fax over some FMLA ppw for [employer] to comp. Call back # [phone]

## 2022-08-11 ENCOUNTER — Encounter: Payer: Self-pay | Admitting: Family

## 2022-08-11 NOTE — Telephone Encounter (Signed)
Pt call to know status of FMLA paperwork

## 2022-08-11 NOTE — Telephone Encounter (Signed)
Spoke with the patient and advised I faxed a copy of her FMLA forms and another copy is ready for pick up from the front desk.

## 2022-08-11 NOTE — Telephone Encounter (Signed)
Fmla complete.  Please let pt know to come pick up.  Given to [employer].

## 2022-08-15 ENCOUNTER — Encounter: Payer: Self-pay | Admitting: Family

## 2022-08-15 ENCOUNTER — Telehealth (INDEPENDENT_AMBULATORY_CARE_PROVIDER_SITE_OTHER): Payer: BC Managed Care – PPO | Admitting: Family

## 2022-08-15 VITALS — Ht 64.0 in | Wt 304.0 lb

## 2022-08-15 DIAGNOSIS — I10 Essential (primary) hypertension: Secondary | ICD-10-CM | POA: Diagnosis not present

## 2022-08-15 DIAGNOSIS — F4323 Adjustment disorder with mixed anxiety and depressed mood: Secondary | ICD-10-CM | POA: Diagnosis not present

## 2022-08-15 MED ORDER — LOSARTAN POTASSIUM-HCTZ 100-25 MG PO TABS: 1.0000 | ORAL_TABLET | Freq: Every day | ORAL | 3 refills | Status: DC

## 2022-08-15 NOTE — Progress Notes (Signed)
Virtual Visit via Video note  I connected with Jasmine Wu on 08/15/22 at home by video and verified that I am speaking with the correct person using two identifiers. Jasmine Wu is currently located at home.The provider, Eugenia Pancoast, FNP is located in their office at time of visit.  I discussed the limitations, risks, security and privacy concerns of performing an evaluation and management service by video and the availability of in person appointments. I also discussed with the patient that there may be a patient responsible charge related to this service. The patient expressed understanding and agreed to proceed.  Subjective: PCP: Eugenia Pancoast, FNP  Chief Complaint  Patient presents with   Anxiety    Anxiety      Recently seen for anxiety/depression, emotional lability, was on paxil and recently switched to prozac 20 mg from paxil 30 mg once daily. She does state the prozac makes her feel a little tired, but not often only when she first gets up in the morning. Was referred to therapy last week as well, she is emailing the psychology center, and is pending response from them at this moment to make appt. Just filled out the intake forms. Not crying as much.   She did see someone virtually through her work, which was helpful and has a few upcoming appointments that will help in regards to getting established.   She has bene able to see her daughter, and was able to see her grand daughter as well. She is doing really well, still in hospital but soon to be discharged.   She does feel at this time she can go back to work with goal date of 3/25. 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   HTN: denies cp palp or sob. Last time at home she checked her blood pressure was at 130/80 or so. No pedal edema.     ROS: Per HPI  Current Outpatient Medications:    FLUoxetine (PROZAC) 20 MG tablet, Take 1 tablet (20 mg total) by mouth daily., Disp: 90 tablet,  Rfl: 0   hydrOXYzine (VISTARIL) 25 MG capsule, Take 1 capsule (25 mg total) by mouth every 8 (eight) hours as needed for anxiety., Disp: 30 capsule, Rfl: 0   metFORMIN (GLUCOPHAGE) 500 MG tablet, Take 1 tablet (500 mg total) by mouth daily with breakfast., Disp: 90 tablet, Rfl: 1   pantoprazole (PROTONIX) 40 MG tablet, TAKE 1 TABLET BY MOUTH DAILY, Disp: 90 tablet, Rfl: 0   losartan-hydrochlorothiazide (HYZAAR) 100-25 MG tablet, Take 1 tablet by mouth daily., Disp: 90 tablet, Rfl: 3  Observations/Objective: Physical Exam Constitutional:      General: She is not in acute distress.    Appearance: Normal appearance. She is not ill-appearing.  Pulmonary:     Effort: Pulmonary effort is normal.  Neurological:     General: No focal deficit present.     Mental Status: She is alert and oriented to person, place, and time.  Psychiatric:        Mood and Affect: Mood normal.        Behavior: Behavior normal.        Thought Content: Thought content normal.     Assessment and Plan: Essential hypertension Assessment & Plan: Refill losartan hctz  Continue with low sodium diet.  Orders: -     Losartan Potassium-HCTZ; Take 1 tablet by mouth daily.  Dispense: 90 tablet; Refill: 3  Adjustment disorder with mixed anxiety and depressed mood Assessment & Plan: Continue with  therapist Return to work as scheduled for 3/25 Continue prozac 20 mg once daily, may increase after 4 weeks once we f/u to discuss further       Follow Up Instructions: Return in about 4 weeks (around 09/12/2022) for f/u anxiety.   I discussed the assessment and treatment plan with the patient. The patient was provided an opportunity to ask questions and all were answered. The patient agreed with the plan and demonstrated an understanding of the instructions.   The patient was advised to call back or seek an in-person evaluation if the symptoms worsen or if the condition fails to improve as anticipated.  The above  assessment and management plan was discussed with the patient. The patient verbalized understanding of and has agreed to the management plan. Patient is aware to call the clinic if symptoms persist or worsen. Patient is aware when to return to the clinic for a follow-up visit. Patient educated on when it is appropriate to go to the emergency department.   Time call ended: 1005  I provided 15 minutes of face-to-face time during this encounter.   Eugenia Pancoast, MSN, APRN, FNP-C [place]

## 2022-08-15 NOTE — Assessment & Plan Note (Signed)
Refill losartan hctz  Continue with low sodium diet.

## 2022-08-15 NOTE — Assessment & Plan Note (Signed)
Continue with therapist Return to work as scheduled for 3/25 Continue prozac 20 mg once daily, may increase after 4 weeks once we f/u to discuss further

## 2022-09-02 ENCOUNTER — Encounter: Payer: Self-pay | Admitting: Family

## 2022-09-12 ENCOUNTER — Ambulatory Visit: Payer: BC Managed Care – PPO | Admitting: Family

## 2022-09-23 ENCOUNTER — Ambulatory Visit: Payer: BC Managed Care – PPO | Admitting: Family

## 2022-09-25 ENCOUNTER — Encounter: Payer: Self-pay | Admitting: Family

## 2022-09-25 ENCOUNTER — Ambulatory Visit: Payer: BC Managed Care – PPO | Admitting: Family

## 2022-09-25 VITALS — BP 124/74 | HR 74 | Temp 97.3°F | Ht 64.0 in | Wt 307.2 lb

## 2022-09-25 DIAGNOSIS — J029 Acute pharyngitis, unspecified: Secondary | ICD-10-CM

## 2022-09-25 DIAGNOSIS — J039 Acute tonsillitis, unspecified: Secondary | ICD-10-CM

## 2022-09-25 DIAGNOSIS — I10 Essential (primary) hypertension: Secondary | ICD-10-CM

## 2022-09-25 DIAGNOSIS — R739 Hyperglycemia, unspecified: Secondary | ICD-10-CM

## 2022-09-25 DIAGNOSIS — J301 Allergic rhinitis due to pollen: Secondary | ICD-10-CM | POA: Diagnosis not present

## 2022-09-25 DIAGNOSIS — M2550 Pain in unspecified joint: Secondary | ICD-10-CM

## 2022-09-25 LAB — POCT GLYCOSYLATED HEMOGLOBIN (HGB A1C): Hemoglobin A1C: 6 % — AB (ref 4.0–5.6)

## 2022-09-25 LAB — POCT RAPID STREP A (OFFICE): Rapid Strep A Screen: NEGATIVE

## 2022-09-25 LAB — MICROALBUMIN / CREATININE URINE RATIO
Creatinine,U: 146 mg/dL
Microalb Creat Ratio: 0.5 mg/g (ref 0.0–30.0)
Microalb, Ur: <0.7 mg/dL (ref 0.0–1.9)

## 2022-09-25 MED ORDER — METHYLPREDNISOLONE 4 MG PO TBPK
ORAL_TABLET | ORAL | 0 refills | Status: DC
Start: 2022-09-25 — End: 2023-03-05

## 2022-09-25 MED ORDER — AMOXICILLIN 875 MG PO TABS
875.0000 mg | ORAL_TABLET | Freq: Two times a day (BID) | ORAL | 0 refills | Status: AC
Start: 2022-09-25 — End: 2022-10-05

## 2022-09-25 NOTE — Assessment & Plan Note (Signed)
Strep negative in office However right tonsil 3+ will treat  Rx amox 875 mg po bid x 10 days  Rx medrol dose pack for tenderness and enlarged tonsil

## 2022-09-25 NOTE — Progress Notes (Signed)
Established Patient Office Visit  Subjective:   Patient ID: Jasmine Wu, female    DOB: 14-Oct-1970  Age: 52 y.o. MRN: [id number]  CC:  Chief Complaint  Patient presents with   Medical Management of Chronic Issues   Sore Throat    Allergy symptoms    HPI: Jasmine Wu is a 52 y.o. female presenting on 09/25/2022 for Medical Management of Chronic Issues and Sore Throat (Allergy symptoms)   Sore Throat   Tetanus: overdue. Agreeable to get today.   Over the allergy season runny nose watery eyes and itchy ears. No ear pain, slight sore throat from drainage. Slight nonproductive cough with yellow sputum at times. Also with some nasal congestion.   Taking zyrtec nightly and on flonase without much relief.   Lab Results  Component Value Date   HGBA1C 6.4 (H) 03/28/2022   Pap smear: total hysterectomy, only with one ovary. No h/o abnormal paps. Aunt did have h/o ovarian cancer.   Anxiety depression: tried to switch to prozac however switched back to paxil 30 mg once daily. She is starting to feel a little better at this time. Does see a therapist, currently once a week.  HTN: blood pressure stable looks good today 124/74.   Low serum b12, is taking daily b12     ROS: Negative unless specifically indicated above in HPI.   Relevant past medical history reviewed and updated as indicated.   Allergies and medications reviewed and updated.   Current Outpatient Medications:    amoxicillin (AMOXIL) 875 MG tablet, Take 1 tablet (875 mg total) by mouth 2 (two) times daily for 10 days., Disp: 20 tablet, Rfl: 0   cetirizine (ZYRTEC) 10 MG tablet, Take 10 mg by mouth daily., Disp: , Rfl:    cholecalciferol (VITAMIN D3) 25 MCG (1000 UNIT) tablet, Take 2,000 Units by mouth daily., Disp: , Rfl:    cyanocobalamin (VITAMIN B12) 1000 MCG tablet, Take 1,000 mcg by mouth daily., Disp: , Rfl:    hydrOXYzine (VISTARIL) 25 MG capsule, Take 1 capsule (25 mg total) by mouth every 8 (eight)  hours as needed for anxiety., Disp: 30 capsule, Rfl: 0   losartan-hydrochlorothiazide (HYZAAR) 100-25 MG tablet, Take 1 tablet by mouth daily., Disp: 90 tablet, Rfl: 3   metFORMIN (GLUCOPHAGE) 500 MG tablet, Take 1 tablet (500 mg total) by mouth daily with breakfast., Disp: 90 tablet, Rfl: 1   methylPREDNISolone (MEDROL DOSEPAK) 4 MG TBPK tablet, Take per package instructions, Disp: 21 tablet, Rfl: 0   pantoprazole (PROTONIX) 40 MG tablet, TAKE 1 TABLET BY MOUTH DAILY, Disp: 90 tablet, Rfl: 0   PARoxetine (PAXIL) 40 MG tablet, Take 40 mg by mouth once. At night, Disp: , Rfl:   No Known Allergies  Objective:   BP 124/74   Pulse 74   Temp (!) 97.3 F (36.3 C) (Temporal)   Ht 5\' 4"  (1.626 m)   Wt (!) 307 lb 3.2 oz (139.3 kg)   SpO2 98%   BMI 52.73 kg/m    Physical Exam Vitals reviewed.  Constitutional:      General: She is not in acute distress.    Appearance: Normal appearance. She is obese. She is not ill-appearing, toxic-appearing or diaphoretic.  HENT:     Head: Normocephalic.     Right Ear: Tympanic membrane normal.     Left Ear: Tympanic membrane normal.     Nose: Nose normal.     Mouth/Throat:     Mouth: Mucous membranes are dry.  Pharynx: Posterior oropharyngeal erythema present. No oropharyngeal exudate.     Tonsils: No tonsillar exudate. 2+ on the right. 1+ on the left.  Eyes:     Extraocular Movements: Extraocular movements intact.     Pupils: Pupils are equal, round, and reactive to light.  Cardiovascular:     Rate and Rhythm: Normal rate and regular rhythm.     Pulses: Normal pulses.     Heart sounds: Normal heart sounds.  Pulmonary:     Effort: Pulmonary effort is normal.     Breath sounds: Normal breath sounds.  Musculoskeletal:     Cervical back: Normal range of motion.  Lymphadenopathy:     Cervical: Cervical adenopathy present.     Right cervical: Superficial cervical adenopathy present.     Left cervical: No superficial cervical adenopathy.   Neurological:     General: No focal deficit present.     Mental Status: She is alert and oriented to person, place, and time. Mental status is at baseline.  Psychiatric:        Mood and Affect: Mood normal.        Behavior: Behavior normal.        Thought Content: Thought content normal.        Judgment: Judgment normal.     Assessment & Plan:  Sore throat -     POCT rapid strep A  Seasonal allergic rhinitis due to pollen Assessment & Plan: Stop zyrtec start xyzal continue flonase     Polyarthralgia  Hyperglycemia Assessment & Plan: Pt advised of the following: Work on a diabetic diet, try to incorporate exercise at least 20-30 a day for 3 days a week or more.    Orders: -     POCT glycosylated hemoglobin (Hb A1C)  Essential hypertension -     Microalbumin / creatinine urine ratio  Tonsillitis Assessment & Plan: Strep negative in office However right tonsil 3+ will treat  Rx amox 875 mg po bid x 10 days  Rx medrol dose pack for tenderness and enlarged tonsil   Orders: -     methylPREDNISolone; Take per package instructions  Dispense: 21 tablet; Refill: 0 -     Amoxicillin; Take 1 tablet (875 mg total) by mouth 2 (two) times daily for 10 days.  Dispense: 20 tablet; Refill: 0     Follow up plan: Return in about 6 months (around 03/28/2023) for f/u CPE.  Mort Sawyers, FNP

## 2022-09-25 NOTE — Assessment & Plan Note (Signed)
Stop zyrtec start xyzal continue flonase

## 2022-09-25 NOTE — Assessment & Plan Note (Signed)
Pt advised of the following: Work on a diabetic diet, try to incorporate exercise at least 20-30 a day for 3 days a week or more.   

## 2022-09-26 ENCOUNTER — Ambulatory Visit: Payer: BC Managed Care – PPO | Admitting: Family

## 2022-10-06 ENCOUNTER — Ambulatory Visit: Payer: BC Managed Care – PPO | Admitting: Family

## 2022-10-08 NOTE — Telephone Encounter (Signed)
error 

## 2022-10-16 ENCOUNTER — Other Ambulatory Visit: Payer: Self-pay | Admitting: Family

## 2022-10-16 ENCOUNTER — Encounter: Payer: Self-pay | Admitting: Family

## 2022-10-16 DIAGNOSIS — J301 Allergic rhinitis due to pollen: Secondary | ICD-10-CM

## 2022-10-16 MED ORDER — MONTELUKAST SODIUM 10 MG PO TABS: 10.0000 mg | ORAL_TABLET | Freq: Every day | ORAL | 3 refills | Status: DC

## 2022-10-16 MED ORDER — MONTELUKAST SODIUM 10 MG PO TABS
10.0000 mg | ORAL_TABLET | Freq: Every day | ORAL | 3 refills | Status: DC
Start: 2022-10-16 — End: 2022-10-16

## 2022-10-17 ENCOUNTER — Telehealth: Payer: BC Managed Care – PPO | Admitting: Family Medicine

## 2022-10-17 DIAGNOSIS — J208 Acute bronchitis due to other specified organisms: Secondary | ICD-10-CM

## 2022-10-17 MED ORDER — FLUTICASONE PROPIONATE 50 MCG/ACT NA SUSP
2.0000 | Freq: Every day | NASAL | 0 refills | Status: DC
Start: 2022-10-17 — End: 2022-10-17

## 2022-10-17 MED ORDER — PSEUDOEPH-BROMPHEN-DM 30-2-10 MG/5ML PO SYRP
5.0000 mL | ORAL_SOLUTION | Freq: Four times a day (QID) | ORAL | 0 refills | Status: DC | PRN
Start: 2022-10-17 — End: 2023-03-05

## 2022-10-17 MED ORDER — ALBUTEROL SULFATE HFA 108 (90 BASE) MCG/ACT IN AERS
2.0000 | INHALATION_SPRAY | Freq: Four times a day (QID) | RESPIRATORY_TRACT | 0 refills | Status: DC | PRN
Start: 2022-10-17 — End: 2023-03-05

## 2022-10-17 MED ORDER — ALBUTEROL SULFATE HFA 108 (90 BASE) MCG/ACT IN AERS
2.0000 | INHALATION_SPRAY | Freq: Four times a day (QID) | RESPIRATORY_TRACT | 0 refills | Status: DC | PRN
Start: 2022-10-17 — End: 2022-10-17

## 2022-10-17 MED ORDER — PSEUDOEPH-BROMPHEN-DM 30-2-10 MG/5ML PO SYRP
5.0000 mL | ORAL_SOLUTION | Freq: Four times a day (QID) | ORAL | 0 refills | Status: DC | PRN
Start: 2022-10-17 — End: 2022-10-17

## 2022-10-17 MED ORDER — FLUTICASONE PROPIONATE 50 MCG/ACT NA SUSP
2.0000 | Freq: Every day | NASAL | 0 refills | Status: DC
Start: 2022-10-17 — End: 2023-03-05

## 2022-10-17 NOTE — Progress Notes (Signed)
Virtual Visit Consent   Jasmine Wu, you are scheduled for a virtual visit with a [facility] provider today. Just as with appointments in the office, your consent must be obtained to participate. Your consent will be active for this visit and any virtual visit you may have with one of our providers in the next 365 days. If you have a MyChart account, a copy of this consent can be sent to you electronically.  As this is a virtual visit, video technology does not allow for your provider to perform a traditional examination. This may limit your provider's ability to fully assess your condition. If your provider identifies any concerns that need to be evaluated in person or the need to arrange testing (such as labs, EKG, etc.), we will make arrangements to do so. Although advances in technology are sophisticated, we cannot ensure that it will always work on either your end or our end. If the connection with a video visit is poor, the visit may have to be switched to a telephone visit. With either a video or telephone visit, we are not always able to ensure that we have a secure connection.  By engaging in this virtual visit, you consent to the provision of healthcare and authorize for your insurance to be billed (if applicable) for the services provided during this visit. Depending on your insurance coverage, you may receive a charge related to this service.  I need to obtain your verbal consent now. Are you willing to proceed with your visit today? Aleza Abramczyk has provided verbal consent on 10/17/2022 for a virtual visit (video or telephone). Freddy Finner, NP  Date: 10/17/2022 10:58 AM  Virtual Visit via Video Note   I, Freddy Finner, connected with  Caddie Torrence  ([id number], 19-Jan-1971) on 10/17/22 at 11:00 AM EDT by a video-enabled telemedicine application and verified that I am speaking with the correct person using two identifiers.  Location: Patient: Virtual Visit Location  Patient: Home Provider: Virtual Visit Location Provider: Home Office   I discussed the limitations of evaluation and management by telemedicine and the availability of in person appointments. The patient expressed understanding and agreed to proceed.    History of Present Illness: [name] is a 52 y.o. who identifies as a female who was assigned female at birth, and is being seen today for cough and cold  Onset was Sunday 10/12/22 after going to a college graduation in [place] out of state.  Associated symptoms are coughing with congestion- chest yellow and green mucus, tightness in chest area, nasal congestion Modifying factors are mucinex,  Denies chest pain, shortness of breath, fevers, chills  Exposure to sick contacts- unknown- but recent travel- and entire family is sick COVID test: negative on home test   Problems:  Patient Active Problem List   Diagnosis Date Noted   Tonsillitis 09/25/2022   Seasonal allergic rhinitis due to pollen 09/25/2022   Vitamin D deficiency 04/01/2022   Polyarthralgia 04/01/2022   Hyperglycemia 04/01/2022   Hot flashes due to menopause 04/01/2022   Low serum vitamin B12 04/01/2022   Dermatitis 10/03/2019   Essential hypertension 07/19/2019   Adjustment disorder with mixed anxiety and depressed mood 07/19/2019   Lumbar radiculopathy, chronic 05/05/2019    Allergies: No Known Allergies Medications:  Current Outpatient Medications:    cetirizine (ZYRTEC) 10 MG tablet, Take 10 mg by mouth daily., Disp: , Rfl:    cholecalciferol (VITAMIN D3) 25 MCG (1000 UNIT) tablet, Take 2,000 Units by mouth daily.,  Disp: , Rfl:    cyanocobalamin (VITAMIN B12) 1000 MCG tablet, Take 1,000 mcg by mouth daily., Disp: , Rfl:    hydrOXYzine (VISTARIL) 25 MG capsule, Take 1 capsule (25 mg total) by mouth every 8 (eight) hours as needed for anxiety., Disp: 30 capsule, Rfl: 0   losartan-hydrochlorothiazide (HYZAAR) 100-25 MG tablet, Take 1 tablet by mouth daily., Disp:  90 tablet, Rfl: 3   metFORMIN (GLUCOPHAGE) 500 MG tablet, Take 1 tablet (500 mg total) by mouth daily with breakfast., Disp: 90 tablet, Rfl: 1   methylPREDNISolone (MEDROL DOSEPAK) 4 MG TBPK tablet, Take per package instructions, Disp: 21 tablet, Rfl: 0   montelukast (SINGULAIR) 10 MG tablet, Take 1 tablet (10 mg total) by mouth at bedtime., Disp: 30 tablet, Rfl: 3   pantoprazole (PROTONIX) 40 MG tablet, TAKE 1 TABLET BY MOUTH DAILY, Disp: 90 tablet, Rfl: 0   PARoxetine (PAXIL) 40 MG tablet, Take 40 mg by mouth once. At night, Disp: , Rfl:   Observations/Objective: Patient is well-developed, well-nourished in no acute distress.  Resting comfortably  at home.  Head is normocephalic, atraumatic.  No labored breathing.  Speech is clear and coherent with logical content.  Patient is alert and oriented at baseline.  Cough present  Assessment and Plan: 1. Viral bronchitis  - fluticasone (FLONASE) 50 MCG/ACT nasal spray; Place 2 sprays into both nostrils daily.  Dispense: 16 g; Refill: 0 - brompheniramine-pseudoephedrine-DM 30-2-10 MG/5ML syrup; Take 5 mLs by mouth 4 (four) times daily as needed.  Dispense: 120 mL; Refill: 0 - albuterol (VENTOLIN HFA) 108 (90 Base) MCG/ACT inhaler; Inhale 2 puffs into the lungs every 6 (six) hours as needed for wheezing or shortness of breath.  Dispense: 8 g; Refill: 0  - Take meds as prescribed - Rest voice - Use a cool mist humidifier especially during the winter months when heat dries out the air. - Use saline nose sprays frequently to help soothe nasal passages if they are drying out. - Stay hydrated by drinking plenty of fluids - Keep thermostat turn down low to prevent drying out which can cause a dry cough.  If you do not improve you will need a follow up visit in person.               Reviewed side effects, risks and benefits of medication.    Patient acknowledged agreement and understanding of the plan.   Past Medical, Surgical, Social History,  Allergies, and Medications have been Reviewed.    Follow Up Instructions: I discussed the assessment and treatment plan with the patient. The patient was provided an opportunity to ask questions and all were answered. The patient agreed with the plan and demonstrated an understanding of the instructions.  A copy of instructions were sent to the patient via MyChart unless otherwise noted below.    The patient was advised to call back or seek an in-person evaluation if the symptoms worsen or if the condition fails to improve as anticipated.  Time:  I spent 10 minutes with the patient via telehealth technology discussing the above problems/concerns.    Freddy Finner, NP

## 2022-10-17 NOTE — Patient Instructions (Signed)
Jasmine Wu, thank you for joining Freddy Finner, NP for today's virtual visit.  While this provider is not your primary care provider (PCP), if your PCP is located in our provider database this encounter information will be shared with them immediately following your visit.   A [facility] MyChart account gives you access to today's visit and all your visits, tests, and labs performed at [facility] " click here if you don't have a [facility] MyChart account or go to [email or web address]  Consent: (Patient) Imaya Saunders provided verbal consent for this virtual visit at the beginning of the encounter.  Current Medications:  Current Outpatient Medications:    albuterol (VENTOLIN HFA) 108 (90 Base) MCG/ACT inhaler, Inhale 2 puffs into the lungs every 6 (six) hours as needed for wheezing or shortness of breath., Disp: 8 g, Rfl: 0   brompheniramine-pseudoephedrine-DM 30-2-10 MG/5ML syrup, Take 5 mLs by mouth 4 (four) times daily as needed., Disp: 120 mL, Rfl: 0   fluticasone (FLONASE) 50 MCG/ACT nasal spray, Place 2 sprays into both nostrils daily., Disp: 16 g, Rfl: 0   cetirizine (ZYRTEC) 10 MG tablet, Take 10 mg by mouth daily., Disp: , Rfl:    cholecalciferol (VITAMIN D3) 25 MCG (1000 UNIT) tablet, Take 2,000 Units by mouth daily., Disp: , Rfl:    cyanocobalamin (VITAMIN B12) 1000 MCG tablet, Take 1,000 mcg by mouth daily., Disp: , Rfl:    hydrOXYzine (VISTARIL) 25 MG capsule, Take 1 capsule (25 mg total) by mouth every 8 (eight) hours as needed for anxiety., Disp: 30 capsule, Rfl: 0   losartan-hydrochlorothiazide (HYZAAR) 100-25 MG tablet, Take 1 tablet by mouth daily., Disp: 90 tablet, Rfl: 3   metFORMIN (GLUCOPHAGE) 500 MG tablet, Take 1 tablet (500 mg total) by mouth daily with breakfast., Disp: 90 tablet, Rfl: 1   methylPREDNISolone (MEDROL DOSEPAK) 4 MG TBPK tablet, Take per package instructions, Disp: 21 tablet, Rfl: 0   montelukast (SINGULAIR) 10 MG tablet,  Take 1 tablet (10 mg total) by mouth at bedtime., Disp: 30 tablet, Rfl: 3   pantoprazole (PROTONIX) 40 MG tablet, TAKE 1 TABLET BY MOUTH DAILY, Disp: 90 tablet, Rfl: 0   PARoxetine (PAXIL) 40 MG tablet, Take 40 mg by mouth once. At night, Disp: , Rfl:    Medications ordered in this encounter:  Meds ordered this encounter  Medications   fluticasone (FLONASE) 50 MCG/ACT nasal spray    Sig: Place 2 sprays into both nostrils daily.    Dispense:  16 g    Refill:  0    Order Specific Question:   Supervising Provider    Answer:   Merrilee Jansky [id number]   brompheniramine-pseudoephedrine-DM 30-2-10 MG/5ML syrup    Sig: Take 5 mLs by mouth 4 (four) times daily as needed.    Dispense:  120 mL    Refill:  0    Order Specific Question:   Supervising Provider    Answer:   Merrilee Jansky [[id number]]   albuterol (VENTOLIN HFA) 108 (90 Base) MCG/ACT inhaler    Sig: Inhale 2 puffs into the lungs every 6 (six) hours as needed for wheezing or shortness of breath.    Dispense:  8 g    Refill:  0    Order Specific Question:   Supervising Provider    Answer:   Merrilee Jansky [id number]     *If you need refills on other medications prior to your next appointment, please contact your pharmacy*  Follow-Up: Call back or seek  an in-person evaluation if the symptoms worsen or if the condition fails to improve as anticipated.  [facility] [phone]  Other Instructions  Acute Bronchitis, Adult  Acute bronchitis is when air tubes in the lungs (bronchi) suddenly get swollen. The condition can make it hard for you to breathe. In adults, acute bronchitis usually goes away within 2 weeks. A cough caused by bronchitis may last up to 3 weeks. Smoking, allergies, and asthma can make the condition worse. What are the causes? Germs that cause cold and flu (viruses). The most common cause of this condition is the virus that causes the common cold. Bacteria. Substances that bother (irritate)  the lungs, including: Smoke from cigarettes and other types of tobacco. Dust and pollen. Fumes from chemicals, gases, or burned fuel. Indoor or outdoor air pollution. What increases the risk? A weak body's defense system. This is also called the immune system. Any condition that affects your lungs and breathing, such as asthma. What are the signs or symptoms? A cough. Coughing up clear, yellow, or green mucus. Making high-pitched whistling sounds when you breathe, most often when you breathe out (wheezing). Runny or stuffy nose. Having too much mucus in your lungs (chest congestion). Shortness of breath. Body aches. A sore throat. How is this treated? Acute bronchitis may go away over time without treatment. Your doctor may tell you to: Drink more fluids. This will help thin your mucus so it is easier to cough up. Use a device that gets medicine into your lungs (inhaler). Use a vaporizer or a humidifier. These are machines that add water to the air. This helps with coughing and poor breathing. Take a medicine that thins mucus and helps clear it from your lungs. Take a medicine that prevents or stops coughing. It is not common to take an antibiotic medicine for this condition. Follow these instructions at home:  Take over-the-counter and prescription medicines only as told by your doctor. Use an inhaler, vaporizer, or humidifier as told by your doctor. Take two teaspoons (10 mL) of honey at bedtime. This helps lessen your coughing at night. Drink enough fluid to keep your pee (urine) pale yellow. Do not smoke or use any products that contain nicotine or tobacco. If you need help quitting, ask your doctor. Get a lot of rest. Return to your normal activities when your doctor says that it is safe. Keep all follow-up visits. How is this prevented?  Wash your hands often with soap and water for at least 20 seconds. If you cannot use soap and water, use hand sanitizer. Avoid contact  with people who have cold symptoms. Try not to touch your mouth, nose, or eyes with your hands. Avoid breathing in smoke or chemical fumes. Make sure to get the flu shot every year. Contact a doctor if: Your symptoms do not get better in 2 weeks. You have trouble coughing up the mucus. Your cough keeps you awake at night. You have a fever. Get help right away if: You cough up blood. You have chest pain. You have very bad shortness of breath. You faint or keep feeling like you are going to faint. You have a very bad headache. Your fever or chills get worse. These symptoms may be an emergency. Get help right away. Call your local emergency services (911 in the [place] Do not wait to see if the symptoms will go away. Do not drive yourself to the hospital. Summary Acute bronchitis is when air tubes in the  lungs (bronchi) suddenly get swollen. In adults, acute bronchitis usually goes away within 2 weeks. Drink more fluids. This will help thin your mucus so it is easier to cough up. Take over-the-counter and prescription medicines only as told by your doctor. Contact a doctor if your symptoms do not improve after 2 weeks of treatment. This information is not intended to replace advice given to you by your health care provider. Make sure you discuss any questions you have with your health care provider. Document Revised: 09/12/2020 Document Reviewed: 09/12/2020 Elsevier Patient Education  2024 Elsevier Inc.   If you have been instructed to have an in-person evaluation today at a local Urgent Care facility, please use the link below. It will take you to a list of all of our available [facility], including address, phone number and hours of operation. Please do not delay care.  [facility]  If you or a family member do not have a primary care provider, use the link below to schedule a visit and establish care. When you choose a [facility] primary care physician or  advanced practice provider, you gain a long-term partner in health. Find a Primary Care Provider  Learn more about [facility] in-office and virtual care options: [facility] - Get Care Now

## 2022-10-17 NOTE — Telephone Encounter (Signed)
Refill for : pantoprazole (PROTONIX) 40 MG tablet   LOV- 09/25/22 NV- Not scheduled

## 2022-10-17 NOTE — Addendum Note (Signed)
Addended by: Freddy Finner on: 10/17/2022 11:27 AM   Modules accepted: Orders

## 2022-10-17 NOTE — Addendum Note (Signed)
Addended by: Margaretann Loveless on: 10/17/2022 07:22 PM   Modules accepted: Orders

## 2022-10-22 ENCOUNTER — Encounter: Payer: Self-pay | Admitting: Physician Assistant

## 2022-10-22 ENCOUNTER — Ambulatory Visit: Payer: BC Managed Care – PPO | Admitting: Physician Assistant

## 2022-10-22 VITALS — BP 160/79 | HR 93 | Temp 96.5°F | Resp 18 | Wt 302.8 lb

## 2022-10-22 DIAGNOSIS — J069 Acute upper respiratory infection, unspecified: Secondary | ICD-10-CM

## 2022-10-22 DIAGNOSIS — J018 Other acute sinusitis: Secondary | ICD-10-CM | POA: Diagnosis not present

## 2022-10-22 MED ORDER — AZITHROMYCIN 250 MG PO TABS: ORAL_TABLET | ORAL | 0 refills | Status: DC

## 2022-10-22 NOTE — Progress Notes (Signed)
   Subjective:    Patient ID: Jasmine Wu, female    DOB: 01-09-1971, 52 y.o.   MRN: [id number]  URI  This is a new problem. The current episode started 1 to 4 weeks ago. The problem has been gradually worsening. The maximum temperature recorded prior to her arrival was 100.4 - 100.9 F. The fever has been present for 1 to 2 days. Associated symptoms include congestion, coughing, headaches, sinus pain and a sore throat. Pertinent negatives include no abdominal pain, chest pain, dysuria, ear pain, joint pain, joint swelling, nausea, rash or vomiting. She has tried increased fluids and acetaminophen (steroids) for the symptoms. The treatment provided no relief.      Review of Systems  Constitutional:  Positive for diaphoresis and fatigue.  HENT:  Positive for congestion, postnasal drip, sinus pain and sore throat. Negative for ear pain.   Respiratory:  Positive for cough. Negative for shortness of breath.   Cardiovascular:  Negative for chest pain.  Gastrointestinal:  Negative for abdominal pain, nausea and vomiting.  Genitourinary:  Negative for dysuria.  Musculoskeletal:  Negative for joint pain.  Skin:  Negative for rash.  Neurological:  Positive for headaches.       Objective:   Physical Exam Constitutional:      Appearance: She is obese.  HENT:     Head: Normocephalic.     Right Ear: Tympanic membrane and ear canal normal.     Left Ear: Tympanic membrane and ear canal normal.     Nose: Congestion present.     Mouth/Throat:     Mouth: Mucous membranes are moist.     Pharynx: Oropharynx is clear.  Eyes:     Extraocular Movements: Extraocular movements intact.     Conjunctiva/sclera: Conjunctivae normal.     Pupils: Pupils are equal, round, and reactive to light.  Cardiovascular:     Rate and Rhythm: Normal rate and regular rhythm.     Pulses: Normal pulses.     Heart sounds: Normal heart sounds.     No friction rub. No gallop.  Pulmonary:     Effort: Pulmonary effort  is normal.     Breath sounds: Normal breath sounds. No wheezing or rales.  Abdominal:     General: Abdomen is flat. Bowel sounds are normal.  Musculoskeletal:        General: Normal range of motion.     Cervical back: Normal range of motion and neck supple.  Lymphadenopathy:     Cervical: No cervical adenopathy.  Skin:    General: Skin is warm and dry.     Capillary Refill: Capillary refill takes less than 2 seconds.     Findings: No rash.  Neurological:     General: No focal deficit present.     Mental Status: She is alert.           Assessment & Plan: 1. Sinusitis  2. Upper Respiratory Infection.   Plan: Patient reports symptoms since May 2nd. History of sore throat, fever, malaise, bronchitis, cough, sinus congestion.  Vital signs reviewed and within normal limits except for blood pressure elevated.  Exam suggest sinusitis and upper respiratory infection. Please increase fluids, wash hands frequently,and use you mask until symptoms resolve. Use Zithromax 2 tabs day 1, then 1 tab daily until all tabs taken. Use flonase daily. Use afrin nasal spray every 8 hours for 1 week. Continue Singular as suggested.. Tylenol every 4 hours for aching or fever. Return to the clinic if symptoms worsen.

## 2022-10-22 NOTE — Patient Instructions (Signed)
Vital signs reviewed and within normal limits except for blood pressure elevated.  Exam suggest sinusitis and upper respiratory infection. Please increase fluids, wash hands frequently,and use you mask until symptoms resolve. Use Zithromax 2 tabs day 1, then 1 tab daily until all tabs taken. Use flonase daily. Use afrin nasal spray every 8 hours for 1 week. Continue Singular as suggested.. Tylenol every 4 hours for aching or fever. Return to the clinic if symptoms worsen.

## 2022-10-27 ENCOUNTER — Other Ambulatory Visit: Payer: Self-pay | Admitting: Family

## 2022-10-28 MED ORDER — PANTOPRAZOLE SODIUM 40 MG PO TBEC: 40.0000 mg | DELAYED_RELEASE_TABLET | Freq: Every day | ORAL | 0 refills | Status: DC

## 2022-11-17 ENCOUNTER — Other Ambulatory Visit: Payer: Self-pay | Admitting: Family

## 2022-11-17 DIAGNOSIS — F4323 Adjustment disorder with mixed anxiety and depressed mood: Secondary | ICD-10-CM

## 2022-11-17 MED ORDER — PAROXETINE HCL 40 MG PO TABS: 40.0000 mg | ORAL_TABLET | Freq: Every day | ORAL | 3 refills | Status: DC

## 2022-11-17 NOTE — Telephone Encounter (Signed)
Seen my chart message where she was going to follow up with you about change. Do no see where that has happened or that you have given in the past.

## 2023-01-23 ENCOUNTER — Other Ambulatory Visit: Payer: Self-pay | Admitting: Family

## 2023-01-28 ENCOUNTER — Ambulatory Visit: Payer: BC Managed Care – PPO | Admitting: Family

## 2023-03-03 DIAGNOSIS — H02829 Cysts of unspecified eye, unspecified eyelid: Secondary | ICD-10-CM | POA: Diagnosis not present

## 2023-03-05 ENCOUNTER — Ambulatory Visit: Payer: BC Managed Care – PPO | Admitting: Internal Medicine

## 2023-03-05 ENCOUNTER — Encounter: Payer: Self-pay | Admitting: Internal Medicine

## 2023-03-05 VITALS — BP 110/70 | HR 89 | Temp 98.9°F | Ht 64.0 in | Wt 298.0 lb

## 2023-03-05 DIAGNOSIS — J029 Acute pharyngitis, unspecified: Secondary | ICD-10-CM | POA: Diagnosis not present

## 2023-03-05 LAB — POCT RAPID STREP A (OFFICE): Rapid Strep A Screen: NEGATIVE

## 2023-03-05 LAB — POC COVID19 BINAXNOW: SARS Coronavirus 2 Ag: NEGATIVE

## 2023-03-05 LAB — POCT INFLUENZA A/B
Influenza A, POC: NEGATIVE
Influenza B, POC: NEGATIVE

## 2023-03-05 NOTE — Assessment & Plan Note (Signed)
COVID, flu and strep tests negative Discussed apparent viral etiology Supportive care--mostly tylenol, rest OOW till 10/14

## 2023-03-05 NOTE — Addendum Note (Signed)
Addended by: Eual Fines on: 03/05/2023 10:58 AM   Modules accepted: Orders

## 2023-03-05 NOTE — Progress Notes (Signed)
Subjective:    Patient ID: Jasmine Wu, female    DOB: 1970-11-18, 52 y.o.   MRN: [id number]  HPI Here due to acute illness  Started yesterday after work Got bad headache--dizzy Felt warm---temp 102 (digital in mouth) Took COVID test--negative  Sore throat No cough Felt chilled last night --and sweating No SOB  Took tylenol--did help fever  Current Outpatient Medications on File Prior to Visit  Medication Sig Dispense Refill   cholecalciferol (VITAMIN D3) 25 MCG (1000 UNIT) tablet Take 2,000 Units by mouth daily.     cyanocobalamin (VITAMIN B12) 1000 MCG tablet Take 1,000 mcg by mouth daily.     losartan-hydrochlorothiazide (HYZAAR) 100-25 MG tablet Take 1 tablet by mouth daily. 90 tablet 3   montelukast (SINGULAIR) 10 MG tablet Take 1 tablet (10 mg total) by mouth at bedtime. 30 tablet 3   pantoprazole (PROTONIX) 40 MG tablet TAKE ONE TABLET (40 MG TOTAL) BY MOUTH DAILY. 90 tablet 0   PARoxetine (PAXIL) 40 MG tablet Take 1 tablet (40 mg total) by mouth daily. At night 90 tablet 3   metFORMIN (GLUCOPHAGE) 500 MG tablet Take 1 tablet (500 mg total) by mouth daily with breakfast. 90 tablet 1   No current facility-administered medications on file prior to visit.    No Known Allergies  Past Medical History:  Diagnosis Date   Allergy    Hypertension    Lumbar radiculopathy, chronic 05/05/2019   Spinal stenosis, lumbar region, with neurogenic claudication 04/08/2019    Past Surgical History:  Procedure Laterality Date   ABDOMINAL HYSTERECTOMY  2019   one ovary left    CESAREAN SECTION  2000   CESAREAN SECTION  2003   HAND SURGERY     capal tunnell  2012  right    Family History  Problem Relation Age of Onset   Osteoporosis Mother    Hyperparathyroidism Mother    Osteoarthritis Mother    Diabetes Father    Heart disease Father    Hypertension Father    Atrial fibrillation Father    Supraventricular tachycardia Father    Heart attack Father        59's    Rheum arthritis Father    AAA (abdominal aortic aneurysm) Sister        not a smoker   Brain cancer Maternal Grandfather    Diabetes Paternal Grandfather    Emphysema Paternal Grandfather     Social History   Socioeconomic History   Marital status: Married    Spouse name: Not on file   Number of children: 4   Years of education: Not on file   Highest education level: Not on file  Occupational History    Employer: [employer]  Tobacco Use   Smoking status: Never   Smokeless tobacco: Never  Vaping Use   Vaping status: Never Used  Substance and Sexual Activity   Alcohol use: Not Currently   Drug use: Not Currently   Sexual activity: Yes    Partners: Male    Birth control/protection: Surgical  Other Topics Concern   Not on file  Social History Narrative   Not on file   Social Determinants of Health   Financial Resource Strain: Patient Declined (09/24/2022)   Overall Financial Resource Strain (CARDIA)    Difficulty of Paying Living Expenses: Patient declined  Food Insecurity: Unknown (09/24/2022)   Hunger Vital Sign    Worried About Running Out of Food in the Last Year: Patient declined  Ran Out of Food in the Last Year: Not on file  Transportation Needs: Patient Declined (09/24/2022)   PRAPARE - Transportation    Lack of Transportation (Medical): Patient declined    Lack of Transportation (Non-Medical): Patient declined  Physical Activity: Unknown (09/24/2022)   Exercise Vital Sign    Days of Exercise per Week: Patient declined    Minutes of Exercise per Session: Not on file  Stress: Stress Concern Present (09/24/2022)   [employer] of Occupational Health - Occupational Stress Questionnaire    Feeling of Stress : To some extent  Social Connections: Unknown (09/24/2022)   Social Connection and Isolation Panel [NHANES]    Frequency of Communication with Friends and Family: More than three times a week    Frequency of Social Gatherings with Friends and Family:  Patient declined    Attends Religious Services: Patient declined    Database administrator or Organizations: No    Attends Engineer, structural: Not on file    Marital Status: Married  Catering manager Violence: Not on file   Review of Systems No loss of smell or taste Nausea without vomiting Appetite is off    Objective:   Physical Exam Constitutional:      Appearance: Normal appearance.  HENT:     Head:     Comments: No sinus tenderness    Right Ear: Tympanic membrane and ear canal normal.     Left Ear: Tympanic membrane and ear canal normal.     Mouth/Throat:     Comments: Pharynx injected with mildly enlarged tonsils on right No exudate Neck:     Comments: Small non tender ant cervical nodes Pulmonary:     Effort: Pulmonary effort is normal.     Breath sounds: Normal breath sounds. No wheezing or rales.  Musculoskeletal:     Cervical back: Neck supple.  Neurological:     Mental Status: She is alert.            Assessment & Plan:

## 2023-03-06 ENCOUNTER — Ambulatory Visit: Payer: BC Managed Care – PPO | Admitting: Family

## 2023-03-31 ENCOUNTER — Other Ambulatory Visit: Payer: Self-pay | Admitting: Family

## 2023-03-31 DIAGNOSIS — E119 Type 2 diabetes mellitus without complications: Secondary | ICD-10-CM

## 2023-04-02 ENCOUNTER — Ambulatory Visit: Payer: BC Managed Care – PPO | Admitting: Family

## 2023-04-02 ENCOUNTER — Encounter: Payer: Self-pay | Admitting: Family

## 2023-04-02 VITALS — BP 132/80 | HR 78 | Temp 97.3°F | Ht 65.0 in | Wt 302.8 lb

## 2023-04-02 DIAGNOSIS — R350 Frequency of micturition: Secondary | ICD-10-CM

## 2023-04-02 DIAGNOSIS — R311 Benign essential microscopic hematuria: Secondary | ICD-10-CM | POA: Diagnosis not present

## 2023-04-02 DIAGNOSIS — N3001 Acute cystitis with hematuria: Secondary | ICD-10-CM | POA: Insufficient documentation

## 2023-04-02 LAB — POCT URINE DIPSTICK
Bilirubin, UA: NEGATIVE
Glucose, UA: NEGATIVE mg/dL
Ketones, POC UA: NEGATIVE mg/dL
Nitrite, UA: NEGATIVE
POC PROTEIN,UA: 30 — AB
Spec Grav, UA: 1.015 (ref 1.010–1.025)
Urobilinogen, UA: 0.2 U/dL
pH, UA: 6.5 (ref 5.0–8.0)

## 2023-04-02 MED ORDER — NITROFURANTOIN MONOHYD MACRO 100 MG PO CAPS: 100.0000 mg | ORAL_CAPSULE | Freq: Two times a day (BID) | ORAL | 0 refills | Status: AC

## 2023-04-02 NOTE — Assessment & Plan Note (Signed)
poct urine dip in office Urine culture ordered pending results antbx sent to pharmacy, pt to take as directed. Encouraged increased water intake throughout the day. Choosing to treat due to being symptomatic. If no improvement in the next 2 days pt advised to let me know.  Rx macrobid 500 mg po bid x 5 days

## 2023-04-02 NOTE — Progress Notes (Signed)
Established Patient Office Visit  Subjective:      CC:  Chief Complaint  Patient presents with   Urinary Tract Infection    HPI: Jasmine Wu is a 52 y.o. female presenting on 04/02/2023 for Urinary Tract Infection . C/o four days of urinary frequency, urgency, and noticing blood in the urine.  Also with dysuria. No flank pain or chills. No issues with starting a stream of urine.  Some pelvic pressure.   Taking some cranberry juice.        Social history:  Relevant past medical, surgical, family and social history reviewed and updated as indicated. Interim medical history since our last visit reviewed.  Allergies and medications reviewed and updated.  DATA REVIEWED: CHART IN EPIC     ROS: Negative unless specifically indicated above in HPI.    Current Outpatient Medications:    cholecalciferol (VITAMIN D3) 25 MCG (1000 UNIT) tablet, Take 2,000 Units by mouth daily., Disp: , Rfl:    cyanocobalamin (VITAMIN B12) 1000 MCG tablet, Take 1,000 mcg by mouth daily., Disp: , Rfl:    losartan-hydrochlorothiazide (HYZAAR) 100-25 MG tablet, Take 1 tablet by mouth daily., Disp: 90 tablet, Rfl: 3   metFORMIN (GLUCOPHAGE) 500 MG tablet, TAKE ONE TABLET BY MOUTH ONCE A DAY WITH BREAKFAST., Disp: 90 tablet, Rfl: 1   montelukast (SINGULAIR) 10 MG tablet, Take 1 tablet (10 mg total) by mouth at bedtime., Disp: 30 tablet, Rfl: 3   nitrofurantoin, macrocrystal-monohydrate, (MACROBID) 100 MG capsule, Take 1 capsule (100 mg total) by mouth 2 (two) times daily for 5 days., Disp: 10 capsule, Rfl: 0   pantoprazole (PROTONIX) 40 MG tablet, TAKE ONE TABLET (40 MG TOTAL) BY MOUTH DAILY., Disp: 90 tablet, Rfl: 0   PARoxetine (PAXIL) 40 MG tablet, Take 1 tablet (40 mg total) by mouth daily. At night, Disp: 90 tablet, Rfl: 3      Objective:    BP 132/80 (BP Location: Left Arm, Patient Position: Sitting, Cuff Size: Normal)   Pulse 78   Temp (!) 97.3 F (36.3 C) (Temporal)   Ht 5\' 5"   (1.651 m)   Wt (!) 302 lb 12.8 oz (137.3 kg)   SpO2 97%   BMI 50.39 kg/m   Wt Readings from Last 3 Encounters:  04/02/23 (!) 302 lb 12.8 oz (137.3 kg)  03/05/23 298 lb (135.2 kg)  10/22/22 (!) 302 lb 12.8 oz (137.3 kg)    Physical Exam Constitutional:      General: She is not in acute distress.    Appearance: Normal appearance. She is normal weight. She is not ill-appearing, toxic-appearing or diaphoretic.  Cardiovascular:     Rate and Rhythm: Normal rate.  Pulmonary:     Effort: Pulmonary effort is normal.  Abdominal:     General: Abdomen is flat.     Tenderness: There is no abdominal tenderness. There is no right CVA tenderness or left CVA tenderness.  Neurological:     General: No focal deficit present.     Mental Status: She is alert and oriented to person, place, and time. Mental status is at baseline.     Motor: No weakness.  Psychiatric:        Mood and Affect: Mood normal.        Behavior: Behavior normal.        Thought Content: Thought content normal.        Judgment: Judgment normal.           Assessment & Plan:  Urinary frequency -  POCT URINE DIPSTICK -     Urinalysis w microscopic + reflex cultur; Future -     Nitrofurantoin Monohyd Macro; Take 1 capsule (100 mg total) by mouth 2 (two) times daily for 5 days.  Dispense: 10 capsule; Refill: 0  Benign essential microscopic hematuria -     Urinalysis w microscopic + reflex cultur; Future -     Nitrofurantoin Monohyd Macro; Take 1 capsule (100 mg total) by mouth 2 (two) times daily for 5 days.  Dispense: 10 capsule; Refill: 0  Acute cystitis with hematuria Assessment & Plan: poct urine dip in office Urine culture ordered pending results antbx sent to pharmacy, pt to take as directed. Encouraged increased water intake throughout the day. Choosing to treat due to being symptomatic. If no improvement in the next 2 days pt advised to let me know. Rx macrobid 500 mg po bid x 5 days.   Orders: -      Nitrofurantoin Monohyd Macro; Take 1 capsule (100 mg total) by mouth 2 (two) times daily for 5 days.  Dispense: 10 capsule; Refill: 0     Return if symptoms worsen or fail to improve.  Mort Sawyers, MSN, APRN, FNP-C [facility]

## 2023-04-05 LAB — URINALYSIS W MICROSCOPIC + REFLEX CULTURE
Bilirubin Urine: NEGATIVE
Glucose, UA: NEGATIVE
Ketones, ur: NEGATIVE
Nitrites, Initial: POSITIVE — AB
Specific Gravity, Urine: 1.02 (ref 1.001–1.035)
WBC, UA: >=60 /HPF — AB (ref 0–5)
pH: 7 (ref 5.0–8.0)

## 2023-04-05 LAB — URINE CULTURE
MICRO NUMBER:: 15704883
SPECIMEN QUALITY:: ADEQUATE

## 2023-04-05 LAB — CULTURE INDICATED

## 2023-04-13 ENCOUNTER — Encounter: Payer: Self-pay | Admitting: Family

## 2023-04-13 DIAGNOSIS — R300 Dysuria: Secondary | ICD-10-CM

## 2023-04-13 DIAGNOSIS — N3001 Acute cystitis with hematuria: Secondary | ICD-10-CM

## 2023-04-13 NOTE — Telephone Encounter (Signed)
Spoke with pt and she has been scheduled for a lab only appointment tomorrow at 0800.

## 2023-04-13 NOTE — Telephone Encounter (Signed)
Can we set her up to come in and leave another urine sample and also order a wet prep? Is she having any vaginal discharge or vaginal itching?

## 2023-04-14 ENCOUNTER — Other Ambulatory Visit: Payer: BC Managed Care – PPO

## 2023-04-14 DIAGNOSIS — R300 Dysuria: Secondary | ICD-10-CM

## 2023-04-14 DIAGNOSIS — N3001 Acute cystitis with hematuria: Secondary | ICD-10-CM

## 2023-04-15 ENCOUNTER — Other Ambulatory Visit: Payer: Self-pay | Admitting: Family

## 2023-04-15 DIAGNOSIS — N760 Acute vaginitis: Secondary | ICD-10-CM

## 2023-04-15 MED ORDER — METRONIDAZOLE 500 MG PO TABS: 500.0000 mg | ORAL_TABLET | Freq: Two times a day (BID) | ORAL | 0 refills | Status: AC

## 2023-04-16 ENCOUNTER — Other Ambulatory Visit: Payer: Self-pay | Admitting: Family

## 2023-04-16 DIAGNOSIS — N3000 Acute cystitis without hematuria: Secondary | ICD-10-CM

## 2023-04-16 LAB — WET PREP BY MOLECULAR PROBE
Candida species: NOT DETECTED
MICRO NUMBER:: 15750618
SPECIMEN QUALITY:: ADEQUATE
Trichomonas vaginosis: NOT DETECTED

## 2023-04-16 LAB — URINE CULTURE
MICRO NUMBER:: 15750619
SPECIMEN QUALITY:: ADEQUATE

## 2023-04-16 MED ORDER — NITROFURANTOIN MONOHYD MACRO 100 MG PO CAPS: 100.0000 mg | ORAL_CAPSULE | Freq: Two times a day (BID) | ORAL | 0 refills | Status: AC

## 2023-04-16 NOTE — Progress Notes (Signed)
Can we see why this urine culture says source vaginal?  This should be a urine sample.  We already had result from wet prep for vaginal swab.

## 2023-04-16 NOTE — Progress Notes (Signed)
Not necessarily worried about billing in this case, more about if the urine culture will actually result as urine for evaluation of UTI as it appears as though it has already resulting and not being processed further.

## 2023-04-20 ENCOUNTER — Other Ambulatory Visit: Payer: Self-pay | Admitting: Family

## 2023-04-20 DIAGNOSIS — J301 Allergic rhinitis due to pollen: Secondary | ICD-10-CM

## 2023-04-28 ENCOUNTER — Inpatient Hospital Stay
Admission: RE | Admit: 2023-04-28 | Discharge: 2023-04-28 | Disposition: A | Discharge status: Home / Self Care | Payer: Self-pay | Source: Ambulatory Visit | Attending: Family | Admitting: Family

## 2023-04-28 ENCOUNTER — Other Ambulatory Visit: Payer: Self-pay | Admitting: *Deleted

## 2023-04-28 DIAGNOSIS — Z1231 Encounter for screening mammogram for malignant neoplasm of breast: Secondary | ICD-10-CM

## 2023-05-01 ENCOUNTER — Ambulatory Visit
Admission: RE | Admit: 2023-05-01 | Discharge: 2023-05-01 | Disposition: A | Discharge status: Home / Self Care | Payer: BC Managed Care – PPO | Source: Ambulatory Visit | Attending: Family | Admitting: Family

## 2023-05-01 DIAGNOSIS — Z1231 Encounter for screening mammogram for malignant neoplasm of breast: Secondary | ICD-10-CM | POA: Insufficient documentation

## 2023-05-05 ENCOUNTER — Other Ambulatory Visit: Payer: Self-pay | Admitting: Family

## 2023-05-05 DIAGNOSIS — R928 Other abnormal and inconclusive findings on diagnostic imaging of breast: Secondary | ICD-10-CM

## 2023-05-06 ENCOUNTER — Other Ambulatory Visit: Payer: Self-pay | Admitting: *Deleted

## 2023-05-06 ENCOUNTER — Inpatient Hospital Stay
Admission: RE | Admit: 2023-05-06 | Discharge: 2023-05-06 | Disposition: A | Discharge status: Home / Self Care | Payer: Self-pay | Source: Ambulatory Visit | Attending: Family | Admitting: Family

## 2023-05-06 DIAGNOSIS — Z1231 Encounter for screening mammogram for malignant neoplasm of breast: Secondary | ICD-10-CM

## 2023-05-08 ENCOUNTER — Ambulatory Visit
Admission: RE | Admit: 2023-05-08 | Discharge: 2023-05-08 | Disposition: A | Discharge status: Home / Self Care | Payer: BC Managed Care – PPO | Source: Ambulatory Visit | Attending: Family | Admitting: Family

## 2023-05-08 DIAGNOSIS — R928 Other abnormal and inconclusive findings on diagnostic imaging of breast: Secondary | ICD-10-CM | POA: Diagnosis not present

## 2023-05-08 DIAGNOSIS — R92333 Mammographic heterogeneous density, bilateral breasts: Secondary | ICD-10-CM | POA: Diagnosis not present

## 2023-05-26 ENCOUNTER — Ambulatory Visit: Payer: Self-pay | Admitting: Family

## 2023-05-26 NOTE — Telephone Encounter (Signed)
 Copied from CRM [id number]. Topic: Clinical - Pink Word Triage >> May 26, 2023  3:22 PM Rosina D wrote: Reason for Triage: pt called stating she is having a hard time going to the bathroom. Pt stated she has taken some magnesium citrate  and dulcolax chews at 9:30am and it has not worked yet. Pt has also drunken a lot of water, coffee and chicken broth and it also not helping   Chief Complaint: Constipation Symptoms: Constipation and hard abdomen Frequency: 4 days Pertinent Negatives: Patient denies fever and vomiting Disposition: [] ED /[x] Urgent Care (no appt availability in office) / [] Appointment(In office/virtual)/ []  [facility]/ [] Home Care/ [] Refused Recommended Disposition /[] [facility]/ []  Follow-up with PCP Additional Notes: Patient called in complaining of constipation that has been ongoing for the past 4 days. Patient stated that she has struggled with constipation in the past. Patient stated that her abdomen feels hard to the touch, but she is not in pain. Patient stated that slight nausea is present. This morning, patient started taking magnesium citrate, laxatives and stool softeners, but has yet to feel relief. Advised patient to see a provider within 24 hours. Advised UC due to office closure for holiday. Patient complied. Scheduled patient and UC visit for tomorrow at 2:30pm. Advised patient to continue home remedies until and to call back for worsening symptoms.    Reason for Disposition  Abdomen is more swollen than usual  Answer Assessment - Initial Assessment Questions 1. STOOL PATTERN OR FREQUENCY: How often do you have a bowel movement (BM)?  (Normal range: 3 times a day to every 3 days)  When was your last BM?       Patient stated that she has had issues with constipation in the past, her last BM was 4 days ago and it looked like little pebbles  2. STRAINING: Do you have to strain to have a BM?      Yes  3. RECTAL PAIN: Does your rectum  hurt when the stool comes out? If Yes, ask: Do you have hemorrhoids? How bad is the pain?  (Scale 1-10; or mild, moderate, severe)     Patient denies  4. STOOL COMPOSITION: Are the stools hard?      Yes  5. BLOOD ON STOOLS: Has there been any blood on the toilet tissue or on the surface of the BM? If Yes, ask: When was the last time?     Patient denies  6. CHRONIC CONSTIPATION: Is this a new problem for you?  If No, ask: How long have you had this problem? (days, weeks, months)      Patient has struggled with constipation before- it happened in October  7. CHANGES IN DIET OR HYDRATION: Have there been any recent changes in your diet? How much fluids are you drinking on a daily basis?  How much have you had to drink today?     Patient denies  8. MEDICINES: Have you been taking any new medicines? Are you taking any narcotic pain medicines? (e.g., Dilaudid, morphine, Percocet, Vicodin)     Started taking a probiotic about a month ago  9. LAXATIVES: Have you been using any stool softeners, laxatives, or enemas?  If Yes, ask What, how often, and when was the last time?     Patient stated that this morning she started taking magnesium citrate, laxative chews, and stool softeners  10. ACTIVITY:  How much walking do you do every day?  Has your activity level decreased in  the past week?        Sedentary job  11. CAUSE: What do you think is causing the constipation?        Unknown- this has happened before  12. OTHER SYMPTOMS: Do you have any other symptoms? (e.g., abdomen pain, bloating, fever, vomiting)       Stomach cramps every 30-9mins, hard abdomen, patient stated that the abdominal pain is not excruciating  13. MEDICAL HISTORY: Do you have a history of hemorrhoids, rectal fissures, or rectal surgery or rectal abscess?         Small Hemorrhoid currently  Protocols used: Constipation-A-AH

## 2023-05-27 ENCOUNTER — Ambulatory Visit: Payer: Self-pay

## 2023-05-28 NOTE — Telephone Encounter (Signed)
 Noted.

## 2023-05-28 NOTE — Telephone Encounter (Signed)
 Per chart review patient called and canceled appointment with urgent care. Per note she was feeling better.

## 2023-06-03 ENCOUNTER — Encounter: Payer: Self-pay | Admitting: Family

## 2023-06-03 ENCOUNTER — Telehealth: Payer: Self-pay

## 2023-06-03 ENCOUNTER — Ambulatory Visit (INDEPENDENT_AMBULATORY_CARE_PROVIDER_SITE_OTHER): Payer: BC Managed Care – PPO | Admitting: Family

## 2023-06-03 ENCOUNTER — Telehealth: Payer: Self-pay | Admitting: Pharmacist

## 2023-06-03 ENCOUNTER — Other Ambulatory Visit (HOSPITAL_COMMUNITY): Payer: Self-pay

## 2023-06-03 VITALS — BP 156/90 | HR 78 | Temp 98.1°F | Ht 65.0 in | Wt 302.6 lb

## 2023-06-03 DIAGNOSIS — I10 Essential (primary) hypertension: Secondary | ICD-10-CM

## 2023-06-03 DIAGNOSIS — F4323 Adjustment disorder with mixed anxiety and depressed mood: Secondary | ICD-10-CM

## 2023-06-03 DIAGNOSIS — Z7984 Long term (current) use of oral hypoglycemic drugs: Secondary | ICD-10-CM | POA: Diagnosis not present

## 2023-06-03 DIAGNOSIS — E119 Type 2 diabetes mellitus without complications: Secondary | ICD-10-CM | POA: Diagnosis not present

## 2023-06-03 DIAGNOSIS — Z0000 Encounter for general adult medical examination without abnormal findings: Secondary | ICD-10-CM | POA: Diagnosis not present

## 2023-06-03 DIAGNOSIS — Z23 Encounter for immunization: Secondary | ICD-10-CM

## 2023-06-03 DIAGNOSIS — E538 Deficiency of other specified B group vitamins: Secondary | ICD-10-CM | POA: Diagnosis not present

## 2023-06-03 DIAGNOSIS — R739 Hyperglycemia, unspecified: Secondary | ICD-10-CM

## 2023-06-03 DIAGNOSIS — E559 Vitamin D deficiency, unspecified: Secondary | ICD-10-CM

## 2023-06-03 LAB — LIPID PANEL
Cholesterol: 179 mg/dL (ref 0–200)
HDL: 48.3 mg/dL (ref >39.00)
LDL Cholesterol: 111 mg/dL — ABNORMAL HIGH (ref 0–99)
NonHDL: 130.7
Total CHOL/HDL Ratio: 4
Triglycerides: 98 mg/dL (ref 0.0–149.0)
VLDL: 19.6 mg/dL (ref 0.0–40.0)

## 2023-06-03 LAB — CBC
HCT: 40.4 % (ref 36.0–46.0)
Hemoglobin: 12.9 g/dL (ref 12.0–15.0)
MCHC: 32 g/dL (ref 30.0–36.0)
MCV: 82.4 fL (ref 78.0–100.0)
Platelets: 436 10*3/uL — ABNORMAL HIGH (ref 150.0–400.0)
RBC: 4.9 Mil/uL (ref 3.87–5.11)
RDW: 14.9 % (ref 11.5–15.5)
WBC: 10.9 10*3/uL — ABNORMAL HIGH (ref 4.0–10.5)

## 2023-06-03 LAB — COMPREHENSIVE METABOLIC PANEL
ALT: 21 U/L (ref 0–35)
AST: 17 U/L (ref 0–37)
Albumin: 4.3 g/dL (ref 3.5–5.2)
Alkaline Phosphatase: 72 U/L (ref 39–117)
BUN: 20 mg/dL (ref 6–23)
CO2: 30 meq/L (ref 19–32)
Calcium: 10 mg/dL (ref 8.4–10.5)
Chloride: 98 meq/L (ref 96–112)
Creatinine, Ser: 0.67 mg/dL (ref 0.40–1.20)
GFR: 98.93 mL/min (ref >60.00)
Glucose, Bld: 116 mg/dL — ABNORMAL HIGH (ref 70–99)
Potassium: 4 meq/L (ref 3.5–5.1)
Sodium: 137 meq/L (ref 135–145)
Total Bilirubin: 0.6 mg/dL (ref 0.2–1.2)
Total Protein: 6.9 g/dL (ref 6.0–8.3)

## 2023-06-03 LAB — MICROALBUMIN / CREATININE URINE RATIO
Creatinine,U: 263.2 mg/dL
Microalb Creat Ratio: 0.7 mg/g (ref 0.0–30.0)
Microalb, Ur: 1.9 mg/dL (ref 0.0–1.9)

## 2023-06-03 LAB — VITAMIN D 25 HYDROXY (VIT D DEFICIENCY, FRACTURES): VITD: 15.51 ng/mL — ABNORMAL LOW (ref 30.00–100.00)

## 2023-06-03 LAB — HEMOGLOBIN A1C: Hgb A1c MFr Bld: 6.5 % (ref 4.6–6.5)

## 2023-06-03 LAB — VITAMIN B12: Vitamin B-12: 349 pg/mL (ref 211–911)

## 2023-06-03 MED ORDER — OZEMPIC (0.25 OR 0.5 MG/DOSE) 2 MG/3ML ~~LOC~~ SOPN: 0.5000 mg | PEN_INJECTOR | SUBCUTANEOUS | 0 refills | Status: DC

## 2023-06-03 MED ORDER — OZEMPIC (0.25 OR 0.5 MG/DOSE) 2 MG/3ML ~~LOC~~ SOPN: 0.2500 mg | PEN_INJECTOR | SUBCUTANEOUS | 0 refills | Status: DC

## 2023-06-03 MED ORDER — SEMAGLUTIDE (1 MG/DOSE) 4 MG/3ML ~~LOC~~ SOPN: 1.0000 mg | PEN_INJECTOR | SUBCUTANEOUS | 1 refills | Status: DC

## 2023-06-03 NOTE — Assessment & Plan Note (Signed)
 Controlled doing well on paxil 40 mg once daily

## 2023-06-03 NOTE — Assessment & Plan Note (Signed)

## 2023-06-03 NOTE — Patient Instructions (Addendum)
  I sent in a prescription for a weight loss medication called ozempic  If approved you can start this as prescribed. Sometimes we need to complete a prior auth prior to approval.   If approved, you will start 0.25 mg once weekly for four weeks, then increase to 0.50 mg once weekly if tolerating well after four weeks. Be sure to make a follow up appointment in three months after starting the medication so we can document weight loss, and monitor how you are doing on the medication.    Regards,   Ginger Patrick FNP-C

## 2023-06-03 NOTE — Assessment & Plan Note (Addendum)
 Pt advised to work on diet and exercise as tolerated  The beneficiary does not have any FDA labeled contraindications to the requested agent including pregnancy, lactation, h/o medullary thyroid  cancer or multiple endocrine neoplasia type II.   Trial ozempic  sent to pharmacy for both obesity and DM2.  Instructions discussed with pt. Will start at 0.25 mg weekly x 4 weeks then increase dose.   Referral sent to diabetic educator  Pt to see therapist for stress eating coping mecanisms

## 2023-06-03 NOTE — Progress Notes (Signed)
 Subjective:  Patient ID: Jasmine Wu, female    DOB: 29-Sep-1970  Age: 53 y.o. MRN: [id number]  Patient Care Team: Corwin Antu, FNP as PCP - General (Family Medicine)   CC:  Chief Complaint  Patient presents with   Annual Exam    Would like to discuss weight loss options.    HPI Jasmine Wu is a 53 y.o. female who presents today for an annual physical exam. She reports consuming a general diet. The patient does not participate in regular exercise at present. She generally feels well. She reports sleeping well. She does have additional problems to discuss today.   Up to date on diabetic eye exam and also up to date on dental exams.   Mammogram: 05/08/23, annuals Last pap: no longer needs, hysterectomy still with one ovary  Colonoscopy:Jul 13, 2018 every five years  TDAP, overdue will get one today.   Pt is with acute concerns.   Obesity, would like to discuss options for weight loss, specifically GLP. Walks often throughout the week. Normal diet but does suffer with boredom and stress eating.    Advanced Directives Patient does not have advanced directives    DEPRESSION SCREENING    06/03/2023    8:04 AM 09/25/2022    8:00 AM 08/15/2022    9:39 AM 08/07/2022   10:30 AM 03/28/2022    2:42 PM 12/16/2021    2:36 PM 09/24/2020   11:30 AM  PHQ 2/9 Scores  PHQ - 2 Score 0 2 2 4  0 0 0  PHQ- 9 Score 2 7 8 17 11 7       ROS: Negative unless specifically indicated above in HPI.    Current Outpatient Medications:    losartan -hydrochlorothiazide (HYZAAR) 100-25 MG tablet, Take 1 tablet by mouth daily., Disp: 90 tablet, Rfl: 3   metFORMIN  (GLUCOPHAGE ) 500 MG tablet, TAKE ONE TABLET BY MOUTH ONCE A DAY WITH BREAKFAST., Disp: 90 tablet, Rfl: 1   montelukast  (SINGULAIR ) 10 MG tablet, TAKE ONE TABLET (10 MG TOTAL) BY MOUTH AT BEDTIME., Disp: 30 tablet, Rfl: 3   pantoprazole  (PROTONIX ) 40 MG tablet, TAKE ONE TABLET (40 MG TOTAL) BY MOUTH DAILY., Disp: 90 tablet, Rfl: 0    PARoxetine  (PAXIL ) 40 MG tablet, Take 1 tablet (40 mg total) by mouth daily. At night, Disp: 90 tablet, Rfl: 3   Semaglutide , 1 MG/DOSE, 4 MG/3ML SOPN, Inject 1 mg as directed once a week., Disp: 3 mL, Rfl: 1   Semaglutide ,0.25 or 0.5MG /DOS, (OZEMPIC , 0.25 OR 0.5 MG/DOSE,) 2 MG/3ML SOPN, Inject 0.25 mg into the skin once a week., Disp: 3 mL, Rfl: 0   [START ON 06/24/2023] Semaglutide ,0.25 or 0.5MG /DOS, (OZEMPIC , 0.25 OR 0.5 MG/DOSE,) 2 MG/3ML SOPN, Inject 0.5 mg into the skin once a week., Disp: 3 mL, Rfl: 0    Objective:    BP (!) 156/90 (BP Location: Left Arm, Patient Position: Sitting, Cuff Size: Large)   Pulse 78   Temp 98.1 F (36.7 C) (Temporal)   Ht 5' 5 (1.651 m)   Wt (!) 302 lb 9.6 oz (137.3 kg)   SpO2 94%   BMI 50.36 kg/m   BP Readings from Last 3 Encounters:  06/03/23 (!) 156/90  04/02/23 132/80  03/05/23 110/70      Physical Exam Constitutional:      General: She is not in acute distress.    Appearance: Normal appearance. She is obese. She is not ill-appearing.  HENT:     Head: Normocephalic.     Right  Ear: Tympanic membrane normal.     Left Ear: Tympanic membrane normal.     Nose: Nose normal.     Mouth/Throat:     Mouth: Mucous membranes are moist.  Eyes:     Extraocular Movements: Extraocular movements intact.     Pupils: Pupils are equal, round, and reactive to light.  Cardiovascular:     Rate and Rhythm: Normal rate and regular rhythm.  Pulmonary:     Effort: Pulmonary effort is normal.     Breath sounds: Normal breath sounds.  Abdominal:     General: Abdomen is flat. Bowel sounds are normal.     Palpations: Abdomen is soft.     Tenderness: There is no guarding or rebound.  Musculoskeletal:        General: Normal range of motion.     Cervical back: Normal range of motion.  Skin:    General: Skin is warm.     Capillary Refill: Capillary refill takes less than 2 seconds.  Neurological:     General: No focal deficit present.     Mental Status: She  is alert.  Psychiatric:        Mood and Affect: Mood normal.        Behavior: Behavior normal.        Thought Content: Thought content normal.        Judgment: Judgment normal.          Assessment & Plan:  Type 2 diabetes mellitus without complication, without long-term current use of insulin (HCC) Assessment & Plan: Ordered hga1c today pending results. Work on diabetic diet and exercise as tolerated. Yearly foot exam, and annual eye exam.    Orders: -     Ozempic  (0.25 or 0.5 MG/DOSE); Inject 0.25 mg into the skin once a week.  Dispense: 3 mL; Refill: 0 -     Ozempic  (0.25 or 0.5 MG/DOSE); Inject 0.5 mg into the skin once a week.  Dispense: 3 mL; Refill: 0 -     Semaglutide  (1 MG/DOSE); Inject 1 mg as directed once a week.  Dispense: 3 mL; Refill: 1 -     Amb Referral to Nutrition and Diabetic Education -     Comprehensive metabolic panel  Morbid obesity (HCC) Assessment & Plan: Pt advised to work on diet and exercise as tolerated  The beneficiary does not have any FDA labeled contraindications to the requested agent including pregnancy, lactation, h/o medullary thyroid  cancer or multiple endocrine neoplasia type II.   Trial ozempic  sent to pharmacy for both obesity and DM2.  Instructions discussed with pt. Will start at 0.25 mg weekly x 4 weeks then increase dose.   Referral sent to diabetic educator  Pt to see therapist for stress eating coping mecanisms   Orders: -     Ozempic  (0.25 or 0.5 MG/DOSE); Inject 0.25 mg into the skin once a week.  Dispense: 3 mL; Refill: 0 -     Ozempic  (0.25 or 0.5 MG/DOSE); Inject 0.5 mg into the skin once a week.  Dispense: 3 mL; Refill: 0 -     Semaglutide  (1 MG/DOSE); Inject 1 mg as directed once a week.  Dispense: 3 mL; Refill: 1  Low serum vitamin B12 Assessment & Plan: Ordered b12 pending results    Orders: -     Lipid panel -     Vitamin B12  Essential hypertension Assessment & Plan: Continue losartan  hctz  Continue  with low sodium diet.  Orders: -  Microalbumin / creatinine urine ratio  Vitamin D  deficiency -     VITAMIN D  25 Hydroxy (Vit-D Deficiency, Fractures)  Hyperglycemia -     Hemoglobin A1c -     Comprehensive metabolic panel  Encounter for general adult medical examination without abnormal findings Assessment & Plan: Patient Counseling(The following topics were reviewed):  Preventative care handout given to pt  Health maintenance and immunizations reviewed. Please refer to Health maintenance section. Pt advised on safe sex, wearing seatbelts in car, and proper nutrition labwork ordered today for annual Dental health: Discussed importance of regular tooth brushing, flossing, and dental visits.   Orders: -     CBC -     Comprehensive metabolic panel -     Lipid panel -     Vitamin B12  Adjustment disorder with mixed anxiety and depressed mood Assessment & Plan: Controlled doing well on paxil  40 mg once daily    Other orders -     Tdap vaccine greater than or equal to 7yo IM      Follow-up: Return in about 6 months (around 12/01/2023) for f/u diabetes.   Ginger Patrick, FNP

## 2023-06-03 NOTE — Assessment & Plan Note (Signed)
 Continue losartan hctz  Continue with low sodium diet.

## 2023-06-03 NOTE — Assessment & Plan Note (Signed)
Ordered hga1c today pending results. Work on diabetic diet and exercise as tolerated. Yearly foot exam, and annual eye exam.   

## 2023-06-03 NOTE — Telephone Encounter (Signed)
 Pharmacy Patient Advocate Encounter   Received notification from Pt Calls Messages that prior authorization for Ozempic  (0.25 or 0.5 MG/DOSE) 2MG /3ML pen-injectors is required/requested.   Insurance verification completed.   The patient is insured through [facility] .   Per test claim: PA required; PA submitted to above mentioned insurance via CoverMyMeds Key/confirmation #/EOC [id number] Status is pending

## 2023-06-03 NOTE — Assessment & Plan Note (Signed)
 Ordered b12 pending results

## 2023-06-03 NOTE — Telephone Encounter (Signed)
 Please start PA.  Copied from CRM [id number]. Topic: Clinical - Prescription Issue >> Jun 03, 2023  9:24 AM Leotis ORN wrote: Reason for CRM: PT stated pharmacy needs prior authorization for Semaglutide ,0.25 or 0.5MG /DOS, (OZEMPIC , 0.25 OR 0.5 MG/DOSE,) 2 MG/3ML SOPN

## 2023-06-04 ENCOUNTER — Encounter: Payer: Self-pay | Admitting: Family

## 2023-06-04 ENCOUNTER — Other Ambulatory Visit: Payer: Self-pay | Admitting: Family

## 2023-06-04 ENCOUNTER — Other Ambulatory Visit (HOSPITAL_COMMUNITY): Payer: Self-pay

## 2023-06-04 DIAGNOSIS — E559 Vitamin D deficiency, unspecified: Secondary | ICD-10-CM

## 2023-06-04 MED ORDER — CHOLECALCIFEROL 1.25 MG (50000 UT) PO TABS: 1.0000 | ORAL_TABLET | ORAL | 0 refills | Status: DC

## 2023-06-04 NOTE — Telephone Encounter (Signed)
 Pharmacy Patient Advocate Encounter  Received notification from [facility] that Prior Authorization for Ozempic  (0.25 or 0.5 MG/DOSE) 2MG /3ML pen-injectors has been APPROVED from 06/03/2023 to 06/02/2024. Ran test claim, Copay is $24.99. This test claim was processed through [facility]- copay amounts may vary at other pharmacies due to pharmacy/plan contracts, or as the patient moves through the different stages of their insurance plan.   PA #/Case ID/Reference #: [id number]

## 2023-06-08 ENCOUNTER — Encounter: Payer: Self-pay | Admitting: Family

## 2023-06-08 DIAGNOSIS — R509 Fever, unspecified: Secondary | ICD-10-CM | POA: Diagnosis not present

## 2023-06-10 ENCOUNTER — Other Ambulatory Visit (HOSPITAL_COMMUNITY): Payer: Self-pay

## 2023-06-10 NOTE — Telephone Encounter (Signed)
 This has been approved in a separate encounter, please see telephone encounter from 06/03/2023 for further details, thank you.

## 2023-06-12 ENCOUNTER — Other Ambulatory Visit (HOSPITAL_COMMUNITY): Payer: Self-pay

## 2023-06-12 NOTE — Telephone Encounter (Signed)
Per test claim: Refill too soon. PA is not needed at this time. Medication was filled 06/04/2023. Next eligible fill date is 06/25/2023.

## 2023-06-22 ENCOUNTER — Ambulatory Visit: Payer: BC Managed Care – PPO | Admitting: Family

## 2023-07-04 ENCOUNTER — Encounter: Payer: Self-pay | Admitting: Intensive Care

## 2023-07-04 ENCOUNTER — Emergency Department: Payer: BC Managed Care – PPO

## 2023-07-04 ENCOUNTER — Emergency Department
Admission: EM | Admit: 2023-07-04 | Discharge: 2023-07-04 | Disposition: A | Discharge status: Home / Self Care | Payer: BC Managed Care – PPO | Attending: Emergency Medicine | Admitting: Emergency Medicine

## 2023-07-04 ENCOUNTER — Other Ambulatory Visit: Payer: Self-pay

## 2023-07-04 DIAGNOSIS — K5900 Constipation, unspecified: Secondary | ICD-10-CM | POA: Diagnosis not present

## 2023-07-04 DIAGNOSIS — R110 Nausea: Secondary | ICD-10-CM | POA: Insufficient documentation

## 2023-07-04 DIAGNOSIS — I10 Essential (primary) hypertension: Secondary | ICD-10-CM | POA: Diagnosis not present

## 2023-07-04 NOTE — Discharge Instructions (Signed)
 As we discussed please begin taking over-the-counter Colace capsules twice daily once in the morning once in the evening.  Please begin using MiraLAX 1 large capful 3 times daily with a large glass of water until you begin having multiple bowel movements at which time you may discontinue the MiraLAX but continue the Colace.  You may use MiraLAX in the future as needed for constipation.

## 2023-07-04 NOTE — ED Triage Notes (Signed)
 Patient c/o constipation X3 weeks. Started ozympic three weeks ago. Reports some pebble Bowel movements. C/o nausea

## 2023-07-04 NOTE — ED Provider Notes (Signed)
 [facility] Provider Note    Event Date/Time   First MD Initiated Contact with Patient 07/04/23 [id number]     (approximate)  History   Chief Complaint: Constipation  HPI  Jasmine Wu is a 53 y.o. female with a past medical history of hypertension presents to the emergency department for constipation.  According to the patient she started Ozempic  approximately 3 weeks ago.  She states over the last 3 weeks she has been very constipated.  States she has tried prune juice, stool softener and Dulcolax without any relief.  Last night she tried an enema but again minimal relief only small bowel movements per patient.  States some nausea at times but denies any vomiting.  Physical Exam   Triage Vital Signs: ED Triage Vitals  Encounter Vitals Group     BP 07/04/23 0921 (!) 144/78     Systolic BP Percentile --      Diastolic BP Percentile --      Pulse Rate 07/04/23 0921 94     Resp 07/04/23 0921 16     Temp 07/04/23 0921 97.7 F (36.5 C)     Temp Source 07/04/23 0921 Oral     SpO2 07/04/23 0921 93 %     Weight 07/04/23 0913 290 lb (131.5 kg)     Height 07/04/23 0913 5' 4 (1.626 m)     Head Circumference --      Peak Flow --      Pain Score 07/04/23 0913 6     Pain Loc --      Pain Education --      Exclude from Growth Chart --     Most recent vital signs: Vitals:   07/04/23 0921  BP: (!) 144/78  Pulse: 94  Resp: 16  Temp: 97.7 F (36.5 C)  SpO2: 93%    General: Awake, no distress.  CV:  Good peripheral perfusion.  Regular rate and rhythm  Resp:  Normal effort.  Equal breath sounds bilaterally.  Abd:  No distention.  Soft, nontender.  No rebound or guarding.  Benign abdomen.  ED Results / Procedures / Treatments   RADIOLOGY  I have reviewed and interpreted x-ray images.  Patient appears to have moderate constipation. Radiology agrees moderate stool burden.  MEDICATIONS ORDERED IN ED: Medications - No data to display   IMPRESSION /  MDM / ASSESSMENT AND PLAN / ED COURSE  I reviewed the triage vital signs and the nursing notes.  Patient's presentation is most consistent with acute illness / injury with system symptoms.  Patient presents to the emergency department for constipation for the past 3 weeks since starting Ozempic .  Suspect the Ozempic  is the cause of the patient's constipation.  Over the last few days she has been taking prune juice and tried a stool softener as well as an enema last night with minimal relief.  Patient states she has been able to have very small bowel movements but has not had a normal bowel movement at any point.  Will obtain a two-view x-ray to rule out any obstructive process.  Highly suspect Ozempic  is the cause of the patient's constipation and would recommend Colace twice daily as well as MiraLAX until effect.  X-ray shows significant stool burden but no obstructive process or impaction.  Discussed with the patient Colace twice daily MiraLAX 3 times daily with a large glass of water until the patient begins having multiple bowel movements, patient to continue Colace.  Patient agreeable to plan  of care.  Will follow-up with her doctor.  FINAL CLINICAL IMPRESSION(S) / ED DIAGNOSES   Constipation   Note:  This document was prepared using Dragon voice recognition software and may include unintentional dictation errors.   Dorothyann Drivers, MD 07/04/23 1003

## 2023-07-30 ENCOUNTER — Other Ambulatory Visit: Payer: Self-pay | Admitting: Family

## 2023-07-30 ENCOUNTER — Encounter: Payer: Self-pay | Admitting: Family

## 2023-07-30 DIAGNOSIS — E119 Type 2 diabetes mellitus without complications: Secondary | ICD-10-CM

## 2023-07-30 MED ORDER — SEMAGLUTIDE (1 MG/DOSE) 4 MG/3ML ~~LOC~~ SOPN: 1.0000 mg | PEN_INJECTOR | SUBCUTANEOUS | 1 refills | Status: DC

## 2023-07-31 ENCOUNTER — Other Ambulatory Visit: Payer: Self-pay | Admitting: Family

## 2023-07-31 DIAGNOSIS — E119 Type 2 diabetes mellitus without complications: Secondary | ICD-10-CM

## 2023-07-31 DIAGNOSIS — K5903 Drug induced constipation: Secondary | ICD-10-CM | POA: Insufficient documentation

## 2023-07-31 MED ORDER — OZEMPIC (0.25 OR 0.5 MG/DOSE) 2 MG/3ML ~~LOC~~ SOPN: 0.5000 mg | PEN_INJECTOR | SUBCUTANEOUS | 2 refills | Status: DC

## 2023-08-11 ENCOUNTER — Other Ambulatory Visit: Payer: Self-pay | Admitting: Family

## 2023-08-21 ENCOUNTER — Other Ambulatory Visit: Payer: Self-pay | Admitting: Family

## 2023-08-21 DIAGNOSIS — I10 Essential (primary) hypertension: Secondary | ICD-10-CM

## 2023-09-02 ENCOUNTER — Other Ambulatory Visit: Payer: Self-pay | Admitting: Family

## 2023-09-02 DIAGNOSIS — J301 Allergic rhinitis due to pollen: Secondary | ICD-10-CM

## 2023-09-21 ENCOUNTER — Other Ambulatory Visit: Payer: Self-pay | Admitting: Family

## 2023-09-21 DIAGNOSIS — E119 Type 2 diabetes mellitus without complications: Secondary | ICD-10-CM

## 2023-10-10 ENCOUNTER — Encounter: Payer: Self-pay | Admitting: Emergency Medicine

## 2023-10-10 ENCOUNTER — Other Ambulatory Visit: Payer: Self-pay

## 2023-10-10 DIAGNOSIS — S6991XA Unspecified injury of right wrist, hand and finger(s), initial encounter: Secondary | ICD-10-CM | POA: Diagnosis not present

## 2023-10-10 DIAGNOSIS — W268XXA Contact with other sharp object(s), not elsewhere classified, initial encounter: Secondary | ICD-10-CM | POA: Insufficient documentation

## 2023-10-10 DIAGNOSIS — Y93D9 Activity, other involving arts and handcrafts: Secondary | ICD-10-CM | POA: Diagnosis not present

## 2023-10-10 DIAGNOSIS — S61210A Laceration without foreign body of right index finger without damage to nail, initial encounter: Secondary | ICD-10-CM | POA: Insufficient documentation

## 2023-10-10 NOTE — ED Triage Notes (Signed)
 Pt in with laceration to R index finger, pt states it is 1in long, sustained while making a quilt - states she was cutting fabric with a rotary cutter and sliced her finger. Bleeding controlled with gauze

## 2023-10-11 ENCOUNTER — Emergency Department
Admission: EM | Admit: 2023-10-11 | Discharge: 2023-10-11 | Disposition: A | Discharge status: Home / Self Care | Attending: Emergency Medicine | Admitting: Emergency Medicine

## 2023-10-11 DIAGNOSIS — S61210A Laceration without foreign body of right index finger without damage to nail, initial encounter: Secondary | ICD-10-CM

## 2023-10-11 MED ORDER — LIDOCAINE-EPINEPHRINE 2 %-1:100000 IJ SOLN
20.0000 mL | Freq: Once | INTRAMUSCULAR | Status: AC
  Administered 2023-10-11: 20 mL
  Filled 2023-10-11: qty 1

## 2023-10-11 NOTE — ED Provider Notes (Signed)
 [facility] Provider Note    Event Date/Time   First MD Initiated Contact with Patient 10/11/23 0008     (approximate)   History   Finger Laceration   HPI  Jasmine Wu is a 53 year old female presenting to the emergency department for evaluation of finger laceration.  Patient was cutting fabric with a rotary cutter when she accidentally sliced her right index finger.  Initial significant bleeding, now controlled.  Denies injury to other areas.  Tetanus up-to-date.     Physical Exam   Triage Vital Signs: ED Triage Vitals  Encounter Vitals Group     BP 10/10/23 2328 128/86     Systolic BP Percentile --      Diastolic BP Percentile --      Pulse Rate 10/10/23 2328 91     Resp 10/10/23 2328 18     Temp 10/10/23 2328 98.1 F (36.7 C)     Temp src --      SpO2 10/10/23 2328 95 %     Weight 10/10/23 2328 262 lb (118.8 kg)     Height 10/10/23 2328 5\' 4"  (1.626 m)     Head Circumference --      Peak Flow --      Pain Score 10/10/23 2336 2     Pain Loc --      Pain Education --      Exclude from Growth Chart --     Most recent vital signs: Vitals:   10/10/23 2328  BP: 128/86  Pulse: 91  Resp: 18  Temp: 98.1 F (36.7 C)  SpO2: 95%     General: Awake, interactive  CV:  Regular rate, good peripheral perfusion.  Resp:  Unlabored respirations.  Abd:  Nondistended.  Neuro:  Symmetric facial movement, fluid speech MSK:   There is a 1 cm linear laceration over the volar aspect of the right index finger with associated gaping.  There is ongoing oozing able to be controlled with direct pressure.  2+ radial pulses.  Intact sensation.  Nailbed not involved.   ED Results / Procedures / Treatments   Labs (all labs ordered are listed, but only abnormal results are displayed) Labs Reviewed - No data to display   EKG EKG independently reviewed interpreted by myself (ER attending) demonstrates:    RADIOLOGY Imaging independently reviewed  and interpreted by myself demonstrates:   Formal Radiology Read:  No results found.  PROCEDURES:  Critical Care performed: No  .Laceration Repair  Date/Time: 10/11/2023 12:53 AM  Performed by: Claria Crofts, MD Authorized by: Claria Crofts, MD   Consent:    Consent obtained:  Verbal   Consent given by:  Patient   Risks, benefits, and alternatives were discussed: yes   Anesthesia:    Anesthesia method:  Local infiltration   Local anesthetic:  Lidocaine 2% WITH epi Laceration details:    Length (cm):  3 Treatment:    Amount of cleaning:  Standard   Irrigation solution:  Tap water   Irrigation method:  Tap Skin repair:    Repair method:  Sutures   Suture size:  6-0   Suture material:  Prolene   Suture technique:  Simple interrupted Repair type:    Repair type:  Simple Post-procedure details:    Dressing:  Open (no dressing)   Procedure completion:  Tolerated    MEDICATIONS ORDERED IN ED: Medications  lidocaine-EPINEPHrine (XYLOCAINE W/EPI) 2 %-1:100000 (with pres) injection 20 mL (has no administration in time range)  IMPRESSION / MDM / ASSESSMENT AND PLAN / ED COURSE  I reviewed the triage vital signs and the nursing notes.  Differential diagnosis includes, but is not limited to, finger laceration without evidence of neurovascular compromise  Patient's presentation is most consistent with acute, uncomplicated illness.  53 year old female presenting with finger laceration.  Laceration repaired as above.  Instructed to have sutures removed in 10 to 14 days.  Tetanus up-to-date.  Strict return precautions provided.  Patient discharged stable condition.      FINAL CLINICAL IMPRESSION(S) / ED DIAGNOSES   Final diagnoses:  Laceration of right index finger without foreign body without damage to nail, initial encounter     Rx / DC Orders   ED Discharge Orders     None        Note:  This document was prepared using Dragon voice recognition software and may  include unintentional dictation errors.   Claria Crofts, MD 10/11/23 [phone]

## 2023-10-11 NOTE — Discharge Instructions (Signed)
 Please have your sutures removed in 10 to 14 days.  You can go to your primary care doctor, urgent care, or return to the ER.  You can wash your hand, but avoid scrubbing along your sutures.  And keep the area clean.  Return to the ER for new or worsening symptoms.

## 2023-10-18 ENCOUNTER — Ambulatory Visit
Admission: EM | Admit: 2023-10-18 | Discharge: 2023-10-18 | Disposition: A | Discharge status: Home / Self Care | Attending: Emergency Medicine | Admitting: Emergency Medicine

## 2023-10-18 DIAGNOSIS — S61210D Laceration without foreign body of right index finger without damage to nail, subsequent encounter: Secondary | ICD-10-CM | POA: Diagnosis not present

## 2023-10-18 DIAGNOSIS — L089 Local infection of the skin and subcutaneous tissue, unspecified: Secondary | ICD-10-CM

## 2023-10-18 DIAGNOSIS — T148XXA Other injury of unspecified body region, initial encounter: Secondary | ICD-10-CM | POA: Diagnosis not present

## 2023-10-18 MED ORDER — DOXYCYCLINE HYCLATE 100 MG PO CAPS: 100.0000 mg | ORAL_CAPSULE | Freq: Two times a day (BID) | ORAL | 0 refills | Status: DC

## 2023-10-18 NOTE — ED Provider Notes (Signed)
 Jasmine Wu    CSN: [id number] Arrival date & time: 10/18/23  1003      History   Chief Complaint Chief Complaint  Patient presents with   Finger Injury    HPI Jasmine Wu is a 53 y.o. female.   Patient presents for evaluation of redness, swelling and pain present to the right index finger beginning 3 days.  Laceration with suturing completed 7 days ago.  Has been keeping covered with a bandage.  Has not attempted further treatment.  Denies drainage or fever.  Past Medical History:  Diagnosis Date   Allergy    Hypertension    Lumbar radiculopathy, chronic 05/05/2019   Spinal stenosis, lumbar region, with neurogenic claudication 04/08/2019    Patient Active Problem List   Diagnosis Date Noted   Drug-induced constipation 07/31/2023   Type 2 diabetes mellitus without complication, without long-term current use of insulin (HCC) 06/03/2023   Morbid obesity (HCC) 06/03/2023   Seasonal allergic rhinitis due to pollen 09/25/2022   Vitamin D  deficiency 04/01/2022   Polyarthralgia 04/01/2022   Hot flashes due to menopause 04/01/2022   Low serum vitamin B12 04/01/2022   Dermatitis 10/03/2019   Essential hypertension 07/19/2019   Adjustment disorder with mixed anxiety and depressed mood 07/19/2019   Lumbar radiculopathy, chronic 05/05/2019    Past Surgical History:  Procedure Laterality Date   ABDOMINAL HYSTERECTOMY  2019   one ovary left    CESAREAN SECTION  2000   CESAREAN SECTION  2003   HAND SURGERY     capal tunnell  2012  right    OB History   No obstetric history on file.      Home Medications    Prior to Admission medications   Medication Sig Start Date End Date Taking? Authorizing Provider  Cholecalciferol  1.25 MG (50000 UT) TABS Take 1 tablet by mouth once a week. 06/04/23  Yes Dugal, Tabitha, FNP  doxycycline  (VIBRAMYCIN ) 100 MG capsule Take 1 capsule (100 mg total) by mouth 2 (two) times daily. 10/18/23  Yes Leonetta Mcgivern, Maybelle Spatz, NP   losartan -hydrochlorothiazide (HYZAAR) 100-25 MG tablet Take 1 tablet by mouth once daily 08/24/23  Yes Dugal, Tabitha, FNP  metFORMIN  (GLUCOPHAGE ) 500 MG tablet TAKE ONE TABLET BY MOUTH ONCE A DAY WITH BREAKFAST. 09/21/23  Yes Dugal, Tabitha, FNP  montelukast  (SINGULAIR ) 10 MG tablet TAKE ONE TABLET (10 MG TOTAL) BY MOUTH AT BEDTIME. 09/02/23  Yes Dugal, Tabitha, FNP  pantoprazole  (PROTONIX ) 40 MG tablet TAKE ONE TABLET BY MOUTH DAILY 08/11/23  Yes Dugal, Tabitha, FNP  PARoxetine  (PAXIL ) 40 MG tablet Take 1 tablet (40 mg total) by mouth daily. At night 11/17/22  Yes Dugal, Tabitha, FNP  Semaglutide ,0.25 or 0.5MG /DOS, (OZEMPIC , 0.25 OR 0.5 MG/DOSE,) 2 MG/3ML SOPN Inject 0.5 mg into the skin once a week. 07/31/23  Yes Felicita Horns, FNP    Family History Family History  Problem Relation Age of Onset   Osteoporosis Mother    Hyperparathyroidism Mother    Osteoarthritis Mother    Diabetes Father    Heart disease Father    Hypertension Father    Atrial fibrillation Father    Supraventricular tachycardia Father    Heart attack Father        107's   Rheum arthritis Father    AAA (abdominal aortic aneurysm) Sister        not a smoker   Brain cancer Maternal Grandfather    Breast cancer Paternal Grandmother    Diabetes Paternal Grandfather  Emphysema Paternal Grandfather     Social History Social History   Tobacco Use   Smoking status: Never   Smokeless tobacco: Never  Vaping Use   Vaping status: Never Used  Substance Use Topics   Alcohol use: Not Currently   Drug use: Not Currently     Allergies   Patient has no known allergies.   Review of Systems Review of Systems   Physical Exam Triage Vital Signs ED Triage Vitals  Encounter Vitals Group     BP 10/18/23 1024 110/77     Systolic BP Percentile --      Diastolic BP Percentile --      Pulse Rate 10/18/23 1024 76     Resp 10/18/23 1024 16     Temp 10/18/23 1024 98.3 F (36.8 C)     Temp Source 10/18/23 1024 Oral      SpO2 10/18/23 1024 96 %     Weight 10/18/23 1024 262 lb (118.8 kg)     Height 10/18/23 1024 5\' 4"  (1.626 m)     Head Circumference --      Peak Flow --      Pain Score 10/18/23 1026 9     Pain Loc --      Pain Education --      Exclude from Growth Chart --    No data found.  Updated Vital Signs BP 110/77 (BP Location: Left Arm)   Pulse 76   Temp 98.3 F (36.8 C) (Oral)   Resp 16   Ht 5\' 4"  (1.626 m)   Wt 262 lb (118.8 kg)   SpO2 96%   BMI 44.97 kg/m   Visual Acuity Right Eye Distance:   Left Eye Distance:   Bilateral Distance:    Right Eye Near:   Left Eye Near:    Bilateral Near:     Physical Exam Constitutional:      Appearance: Normal appearance.  Eyes:     Extraocular Movements: Extraocular movements intact.  Skin:    Comments: Lacerations with sutures in place to the palmar aspect of the distal phalanx of the right index finger, surrounding erythema, moderate swelling and tender to palpation, no drainage noted, sensation intact, capillary refill less than 3 no involvement of the DIP joint  Neurological:     Mental Status: She is alert and oriented to person, place, and time. Mental status is at baseline.      UC Treatments / Results  Labs (all labs ordered are listed, but only abnormal results are displayed) Labs Reviewed - No data to display  EKG   Radiology No results found.  Procedures Procedures (including critical care time)  Medications Ordered in UC Medications - No data to display  Initial Impression / Assessment and Plan / UC Course  I have reviewed the triage vital signs and the nursing notes.  Pertinent labs & imaging results that were available during my care of the patient were reviewed by me and considered in my medical decision making (see chart for details).  Laceration of right index finger without foreign body without damage to the nail, subsequent encounter, infected laceration  Presentation consistent with infection,  prescribed doxycycline, recommended removal of suture he during recommended timeframe of 10 to 14 days, advised daily cleansing and covering as needed based on activity, may continue use of Aleve for pain management, may follow-up again as needed Final Clinical Impressions(s) / UC Diagnoses   Final diagnoses:  Laceration of right index finger without foreign  body without damage to nail, subsequent encounter  Infected laceration   Discharge Instructions      Today you are evaluated for your redness and swelling to your laceration which is consistent with infection  Take doxycycline twice daily for 7 days for treatment  Cleanse wound daily with soap and water during normal hygiene, pat and do not rub, cover with a nonadherent dressing if at risk for contamination otherwise may leave open to air  May take Tylenol  and/or Motrin as needed for pain  Please follow-up for any further concerns  Please return during recommended timeframe for removal  ED Prescriptions     Medication Sig Dispense Auth. Provider   doxycycline (VIBRAMYCIN) 100 MG capsule Take 1 capsule (100 mg total) by mouth 2 (two) times daily. 14 capsule Annica Marinello, Maybelle Spatz, NP      PDMP not reviewed this encounter.   Reena Canning, NP 10/18/23 1046

## 2023-10-18 NOTE — ED Triage Notes (Signed)
 Pt c/o worsening R index finger pain & redness x5 days. Had sutures placed d/t LAC.

## 2023-10-18 NOTE — Discharge Instructions (Addendum)
 Today you are evaluated for your redness and swelling to your laceration which is consistent with infection  Take doxycycline  twice daily for 7 days for treatment  Cleanse wound daily with soap and water during normal hygiene, pat and do not rub, cover with a nonadherent dressing if at risk for contamination otherwise may leave open to air  May take Tylenol  and/or Motrin as needed for pain  Please follow-up for any further concerns  Please return during recommended timeframe for removal

## 2023-11-12 ENCOUNTER — Ambulatory Visit: Admitting: Family

## 2023-11-19 ENCOUNTER — Other Ambulatory Visit: Payer: Self-pay | Admitting: Medical Genetics

## 2023-11-26 ENCOUNTER — Ambulatory Visit: Admitting: Family

## 2023-12-03 ENCOUNTER — Encounter: Payer: Self-pay | Admitting: Family

## 2023-12-03 ENCOUNTER — Ambulatory Visit (INDEPENDENT_AMBULATORY_CARE_PROVIDER_SITE_OTHER): Admitting: Family

## 2023-12-03 ENCOUNTER — Ambulatory Visit: Admitting: Family

## 2023-12-03 VITALS — BP 124/68 | HR 83 | Temp 98.0°F | Ht 64.0 in | Wt 264.0 lb

## 2023-12-03 DIAGNOSIS — F4323 Adjustment disorder with mixed anxiety and depressed mood: Secondary | ICD-10-CM

## 2023-12-03 DIAGNOSIS — E559 Vitamin D deficiency, unspecified: Secondary | ICD-10-CM

## 2023-12-03 DIAGNOSIS — E7800 Pure hypercholesterolemia, unspecified: Secondary | ICD-10-CM

## 2023-12-03 DIAGNOSIS — E119 Type 2 diabetes mellitus without complications: Secondary | ICD-10-CM

## 2023-12-03 DIAGNOSIS — Z1211 Encounter for screening for malignant neoplasm of colon: Secondary | ICD-10-CM | POA: Diagnosis not present

## 2023-12-03 DIAGNOSIS — Z7984 Long term (current) use of oral hypoglycemic drugs: Secondary | ICD-10-CM

## 2023-12-03 DIAGNOSIS — D72829 Elevated white blood cell count, unspecified: Secondary | ICD-10-CM | POA: Diagnosis not present

## 2023-12-03 DIAGNOSIS — I10 Essential (primary) hypertension: Secondary | ICD-10-CM

## 2023-12-03 DIAGNOSIS — Z7985 Long-term (current) use of injectable non-insulin antidiabetic drugs: Secondary | ICD-10-CM

## 2023-12-03 LAB — MICROALBUMIN / CREATININE URINE RATIO
Creatinine,U: 70.8 mg/dL
Microalb Creat Ratio: UNDETERMINED mg/g (ref 0.0–30.0)
Microalb, Ur: <0.7 mg/dL

## 2023-12-03 MED ORDER — OZEMPIC (2 MG/DOSE) 8 MG/3ML ~~LOC~~ SOPN: 2.0000 mg | PEN_INJECTOR | SUBCUTANEOUS | 1 refills | Status: DC

## 2023-12-03 MED ORDER — SERTRALINE HCL 100 MG PO TABS: 100.0000 mg | ORAL_TABLET | Freq: Every day | ORAL | 0 refills | Status: DC

## 2023-12-03 NOTE — Progress Notes (Signed)
 Established Patient Office Visit  Subjective:      CC:  Chief Complaint  Patient presents with   Diabetes    Patient here today to follow up on DM; currently on ozempic  injections weekly and taking metformin  500 mg daily. Patient does not check sugars at home.    Referral    Patient needs new referral for colonoscopy     HPI: Jasmine Wu is a 53 y.o. female presenting on 12/03/2023 for Diabetes (Patient here today to follow up on DM; currently on ozempic  injections weekly and taking metformin  500 mg daily. Patient does not check sugars at home. ) and Referral (Patient needs new referral for colonoscopy )  DM2: currently on ozempic  1 mg weekly has bene over four weeks. She is tolerating without symptoms. She has been stuck at th same weight which is frustarting, for exercise she is taking walks and working on free weights. Last A1c 6.5 6 months ago  Wt Readings from Last 3 Encounters:  12/03/23 264 lb (119.7 kg)  10/18/23 262 lb (118.8 kg)  10/10/23 262 lb (118.8 kg)   Weight issues, having trouble losing weight, has been staying stable however. She has been taking paxil  40 mg for years, and states feels good. No anxiety/depression doing well.       Social history:  Relevant past medical, surgical, family and social history reviewed and updated as indicated. Interim medical history since our last visit reviewed.  Allergies and medications reviewed and updated.  DATA REVIEWED: CHART IN EPIC     ROS: Negative unless specifically indicated above in HPI.    Current Outpatient Medications:    losartan -hydrochlorothiazide (HYZAAR) 100-25 MG tablet, Take 1 tablet by mouth once daily, Disp: 90 tablet, Rfl: 1   metFORMIN  (GLUCOPHAGE ) 500 MG tablet, TAKE ONE TABLET BY MOUTH ONCE A DAY WITH BREAKFAST., Disp: 90 tablet, Rfl: 1   montelukast  (SINGULAIR ) 10 MG tablet, TAKE ONE TABLET (10 MG TOTAL) BY MOUTH AT BEDTIME., Disp: 30 tablet, Rfl: 3   pantoprazole  (PROTONIX ) 40 MG  tablet, TAKE ONE TABLET BY MOUTH DAILY, Disp: 90 tablet, Rfl: 1   Semaglutide , 2 MG/DOSE, (OZEMPIC , 2 MG/DOSE,) 8 MG/3ML SOPN, Inject 2 mg into the skin once a week. for diabetes., Disp: 9 mL, Rfl: 1   sertraline  (ZOLOFT ) 100 MG tablet, Take 1 tablet (100 mg total) by mouth daily., Disp: 30 tablet, Rfl: 0      Objective:    BP 124/68   Pulse 83   Temp 98 F (36.7 C) (Temporal)   Ht 5' 4 (1.626 m)   Wt 264 lb (119.7 kg)   SpO2 98%   BMI 45.32 kg/m   Wt Readings from Last 3 Encounters:  12/03/23 264 lb (119.7 kg)  10/18/23 262 lb (118.8 kg)  10/10/23 262 lb (118.8 kg)    Physical Exam Constitutional:      General: She is not in acute distress.    Appearance: Normal appearance. She is obese. She is not ill-appearing, toxic-appearing or diaphoretic.  HENT:     Head: Normocephalic.  Cardiovascular:     Rate and Rhythm: Normal rate and regular rhythm.  Pulmonary:     Effort: Pulmonary effort is normal.  Musculoskeletal:        General: Normal range of motion.  Neurological:     General: No focal deficit present.     Mental Status: She is alert and oriented to person, place, and time. Mental status is at baseline.  Psychiatric:  Mood and Affect: Mood normal.        Behavior: Behavior normal.        Thought Content: Thought content normal.        Judgment: Judgment normal.           Assessment & Plan:  Type 2 diabetes mellitus without complication, without long-term current use of insulin (HCC) Assessment & Plan: Ordered hga1c today pending results. Work on diabetic diet and exercise as tolerated. Yearly foot exam, and annual eye exam.  Urine m/a ordered today  Increase ozempic  to 2 mg weekly  Continue metformin    Orders: -     Ozempic  (2 MG/DOSE); Inject 2 mg into the skin once a week. for diabetes.  Dispense: 9 mL; Refill: 1 -     Comprehensive metabolic panel with GFR -     Microalbumin / creatinine urine ratio  Adjustment disorder with mixed anxiety  and depressed mood Assessment & Plan: Stop paxil  may be contributing to weight concerns.  Start sertraline  100 mg once daily when normal paxil  dose would be due.   Orders: -     Sertraline  HCl; Take 1 tablet (100 mg total) by mouth daily.  Dispense: 30 tablet; Refill: 0  Screening for colon cancer -     Ambulatory referral to Gastroenterology  Vitamin D  deficiency -     VITAMIN D  25 Hydroxy (Vit-D Deficiency, Fractures)  Elevated LDL cholesterol level Assessment & Plan: Ordered lipid panel, pending results. Work on low cholesterol diet and exercise as tolerated   Orders: -     Lipid panel  Leukocytosis, unspecified type -     CBC  Morbid obesity (HCC)  Essential hypertension Assessment & Plan: Urine m/a ordered today  Blood pressure stable in office today       Return in about 6 months (around 06/04/2024) for f/u CPE.  Ginger Patrick, MSN, APRN, FNP-C [facility]

## 2023-12-03 NOTE — Assessment & Plan Note (Signed)
 Stop paxil  may be contributing to weight concerns.  Start sertraline  100 mg once daily when normal paxil  dose would be due.

## 2023-12-03 NOTE — Patient Instructions (Signed)
 Stop paxil   Go directly sertraline  100 mg the next day when paxil  would normally be due.

## 2023-12-03 NOTE — Assessment & Plan Note (Signed)
 Ordered lipid panel, pending results. Work on low cholesterol diet and exercise as tolerated

## 2023-12-03 NOTE — Assessment & Plan Note (Signed)
 Urine m/a ordered today  Blood pressure stable in office today

## 2023-12-03 NOTE — Assessment & Plan Note (Signed)
 Ordered hga1c today pending results. Work on diabetic diet and exercise as tolerated. Yearly foot exam, and annual eye exam.  Urine m/a ordered today  Increase ozempic  to 2 mg weekly  Continue metformin 

## 2023-12-04 ENCOUNTER — Telehealth: Payer: Self-pay

## 2023-12-04 LAB — CBC
HCT: 38.8 % (ref 36.0–46.0)
Hemoglobin: 12.6 g/dL (ref 12.0–15.0)
MCHC: 32.6 g/dL (ref 30.0–36.0)
MCV: 81 fl (ref 78.0–100.0)
Platelets: 449 K/uL — ABNORMAL HIGH (ref 150.0–400.0)
RBC: 4.78 Mil/uL (ref 3.87–5.11)
RDW: 15.2 % (ref 11.5–15.5)
WBC: 9.1 K/uL (ref 4.0–10.5)

## 2023-12-04 LAB — LIPID PANEL
Cholesterol: 175 mg/dL (ref 0–200)
HDL: 49.2 mg/dL (ref >39.00)
LDL Cholesterol: 102 mg/dL — ABNORMAL HIGH (ref 0–99)
NonHDL: 125.97
Total CHOL/HDL Ratio: 4
Triglycerides: 119 mg/dL (ref 0.0–149.0)
VLDL: 23.8 mg/dL (ref 0.0–40.0)

## 2023-12-04 LAB — COMPREHENSIVE METABOLIC PANEL WITH GFR
ALT: 10 U/L (ref 0–35)
AST: 8 U/L (ref 0–37)
Albumin: 4.2 g/dL (ref 3.5–5.2)
Alkaline Phosphatase: 72 U/L (ref 39–117)
BUN: 19 mg/dL (ref 6–23)
CO2: 27 meq/L (ref 19–32)
Calcium: 10.3 mg/dL (ref 8.4–10.5)
Chloride: 102 meq/L (ref 96–112)
Creatinine, Ser: 0.62 mg/dL (ref 0.40–1.20)
GFR: 100.44 mL/min (ref >60.00)
Glucose, Bld: 86 mg/dL (ref 70–99)
Potassium: 4 meq/L (ref 3.5–5.1)
Sodium: 138 meq/L (ref 135–145)
Total Bilirubin: 0.3 mg/dL (ref 0.2–1.2)
Total Protein: 6.9 g/dL (ref 6.0–8.3)

## 2023-12-04 LAB — VITAMIN D 25 HYDROXY (VIT D DEFICIENCY, FRACTURES): VITD: 19.25 ng/mL — ABNORMAL LOW (ref 30.00–100.00)

## 2023-12-04 NOTE — Telephone Encounter (Signed)
 Pharmacy Patient Advocate Encounter   Received notification from Onbase that prior authorization for Ozempic  (2 MG/DOSE) 8MG /3ML pen-injectors is required/requested.   Insurance verification completed.   The patient is insured through [facility] .   Per test claim: PA required; PA submitted to above mentioned insurance via CoverMyMeds Key/confirmation #/EOC [id number] Status is pending

## 2023-12-05 ENCOUNTER — Other Ambulatory Visit: Payer: Self-pay

## 2023-12-07 ENCOUNTER — Other Ambulatory Visit (HOSPITAL_COMMUNITY): Payer: Self-pay

## 2023-12-07 NOTE — Telephone Encounter (Signed)
 Pharmacy Patient Advocate Encounter  Received notification from [facility] that Prior Authorization for  Ozempic  (2 MG/DOSE) 8MG /3ML pen-injectors  has been APPROVED from 12/04/23 to 12/03/24. Ran test claim, Copay is $75. This test claim was processed through [facility]- copay amounts may vary at other pharmacies due to pharmacy/plan contracts, or as the patient moves through the different stages of their insurance plan.   PA #/Case ID/Reference #: [id number]

## 2023-12-08 ENCOUNTER — Ambulatory Visit: Payer: Self-pay | Admitting: Family

## 2023-12-08 ENCOUNTER — Encounter: Payer: Self-pay | Admitting: Family

## 2023-12-08 DIAGNOSIS — G4719 Other hypersomnia: Secondary | ICD-10-CM

## 2023-12-08 DIAGNOSIS — Z114 Encounter for screening for human immunodeficiency virus [HIV]: Secondary | ICD-10-CM

## 2023-12-08 DIAGNOSIS — R7989 Other specified abnormal findings of blood chemistry: Secondary | ICD-10-CM | POA: Insufficient documentation

## 2023-12-08 DIAGNOSIS — Z1159 Encounter for screening for other viral diseases: Secondary | ICD-10-CM

## 2023-12-09 ENCOUNTER — Other Ambulatory Visit (HOSPITAL_COMMUNITY): Payer: Self-pay

## 2023-12-10 ENCOUNTER — Telehealth: Payer: Self-pay

## 2023-12-10 NOTE — Telephone Encounter (Signed)
 Left detailed message on VM per DPR. Advised her that if [facility] has her information wrong, she would need to take that up with them to correct it. Not sure if she was needing anything else.

## 2023-12-10 NOTE — Telephone Encounter (Signed)
 Copied from CRM [id number]. Topic: General - Other >> Dec 10, 2023 12:57 PM Jayma L wrote: Reason for CRM: patient called to reschedule lab appt, she's now schedule for 12/11/2023 at 815 am.   Patient stated she was trying to register for her colonoscopy with [facility] and they have her information incorrect, asking for a callback about that, we have her dob and zip correct in our system.

## 2023-12-11 ENCOUNTER — Other Ambulatory Visit

## 2023-12-11 ENCOUNTER — Other Ambulatory Visit (INDEPENDENT_AMBULATORY_CARE_PROVIDER_SITE_OTHER)

## 2023-12-11 DIAGNOSIS — R7989 Other specified abnormal findings of blood chemistry: Secondary | ICD-10-CM

## 2023-12-11 DIAGNOSIS — Z1159 Encounter for screening for other viral diseases: Secondary | ICD-10-CM | POA: Diagnosis not present

## 2023-12-11 DIAGNOSIS — Z114 Encounter for screening for human immunodeficiency virus [HIV]: Secondary | ICD-10-CM | POA: Diagnosis not present

## 2023-12-11 DIAGNOSIS — E7489 Other specified disorders of carbohydrate metabolism: Secondary | ICD-10-CM | POA: Diagnosis not present

## 2023-12-11 LAB — IBC + FERRITIN
Ferritin: 38.1 ng/mL (ref 10.0–291.0)
Iron: 44 ug/dL (ref 42–145)
Saturation Ratios: 12.4 % — ABNORMAL LOW (ref 20.0–50.0)
TIBC: 354.2 ug/dL (ref 250.0–450.0)
Transferrin: 253 mg/dL (ref 212.0–360.0)

## 2023-12-11 LAB — FOLATE: Folate: 9.3 ng/mL (ref >5.9)

## 2023-12-12 LAB — HIV ANTIBODY (ROUTINE TESTING W REFLEX): HIV 1&2 Ab, 4th Generation: NONREACTIVE

## 2023-12-12 LAB — CBC WITH DIFFERENTIAL/PLATELET
Absolute Lymphocytes: 1778 {cells}/uL (ref 850–3900)
Absolute Monocytes: 340 {cells}/uL (ref 200–950)
Basophils Absolute: 40 {cells}/uL (ref 0–200)
Basophils Relative: 0.5 %
Eosinophils Absolute: 158 {cells}/uL (ref 15–500)
Eosinophils Relative: 2 %
HCT: 39.7 % (ref 35.0–45.0)
Hemoglobin: 12.4 g/dL (ref 11.7–15.5)
MCH: 26 pg — ABNORMAL LOW (ref 27.0–33.0)
MCHC: 31.2 g/dL — ABNORMAL LOW (ref 32.0–36.0)
MCV: 83.2 fL (ref 80.0–100.0)
MPV: 10 fL (ref 7.5–12.5)
Monocytes Relative: 4.3 %
Neutro Abs: 5585 {cells}/uL (ref 1500–7800)
Neutrophils Relative %: 70.7 %
Platelets: 404 Thousand/uL — ABNORMAL HIGH (ref 140–400)
RBC: 4.77 Million/uL (ref 3.80–5.10)
RDW: 13.7 % (ref 11.0–15.0)
Total Lymphocyte: 22.5 %
WBC: 7.9 Thousand/uL (ref 3.8–10.8)

## 2023-12-12 LAB — HEPATITIS PANEL, ACUTE
Hep A IgM: NONREACTIVE
Hep B C IgM: NONREACTIVE
Hepatitis B Surface Ag: NONREACTIVE
Hepatitis C Ab: NONREACTIVE

## 2023-12-12 LAB — LACTATE DEHYDROGENASE: LDH: 108 U/L — ABNORMAL LOW (ref 120–250)

## 2023-12-14 ENCOUNTER — Ambulatory Visit: Payer: Self-pay | Admitting: Family

## 2023-12-14 DIAGNOSIS — E559 Vitamin D deficiency, unspecified: Secondary | ICD-10-CM

## 2023-12-14 DIAGNOSIS — M791 Myalgia, unspecified site: Secondary | ICD-10-CM

## 2023-12-15 ENCOUNTER — Encounter: Payer: Self-pay | Admitting: Pediatrics

## 2023-12-15 NOTE — Addendum Note (Signed)
 Addended by: CORWIN ANTU on: 12/15/2023 06:57 AM   Modules accepted: Orders

## 2023-12-16 MED ORDER — CHOLECALCIFEROL 1.25 MG (50000 UT) PO TABS: 1.0000 | ORAL_TABLET | ORAL | 0 refills | Status: DC

## 2023-12-19 ENCOUNTER — Other Ambulatory Visit: Payer: Self-pay

## 2023-12-19 ENCOUNTER — Other Ambulatory Visit: Payer: Self-pay | Admitting: Family

## 2023-12-19 ENCOUNTER — Other Ambulatory Visit
Admission: RE | Admit: 2023-12-19 | Discharge: 2023-12-19 | Disposition: A | Discharge status: Home / Self Care | Payer: Self-pay | Source: Ambulatory Visit | Attending: Medical Genetics | Admitting: Medical Genetics

## 2023-12-21 NOTE — Addendum Note (Signed)
 Addended by: CORWIN ANTU on: 12/21/2023 02:36 PM   Modules accepted: Orders

## 2023-12-23 ENCOUNTER — Other Ambulatory Visit: Payer: Self-pay | Admitting: Family

## 2023-12-23 DIAGNOSIS — F4323 Adjustment disorder with mixed anxiety and depressed mood: Secondary | ICD-10-CM

## 2024-01-02 LAB — GENECONNECT MOLECULAR SCREEN: Genetic Analysis Overall Interpretation: NEGATIVE

## 2024-01-06 DIAGNOSIS — G4730 Sleep apnea, unspecified: Secondary | ICD-10-CM | POA: Diagnosis not present

## 2024-01-08 ENCOUNTER — Ambulatory Visit: Payer: Self-pay | Admitting: Family

## 2024-01-08 ENCOUNTER — Encounter: Payer: Self-pay | Admitting: Family

## 2024-01-08 ENCOUNTER — Ambulatory Visit: Admitting: Family

## 2024-01-08 VITALS — BP 126/84 | HR 69 | Temp 97.7°F | Ht 65.0 in | Wt 259.0 lb

## 2024-01-08 DIAGNOSIS — R011 Cardiac murmur, unspecified: Secondary | ICD-10-CM

## 2024-01-08 DIAGNOSIS — E119 Type 2 diabetes mellitus without complications: Secondary | ICD-10-CM | POA: Diagnosis not present

## 2024-01-08 DIAGNOSIS — J0100 Acute maxillary sinusitis, unspecified: Secondary | ICD-10-CM

## 2024-01-08 DIAGNOSIS — G4733 Obstructive sleep apnea (adult) (pediatric): Secondary | ICD-10-CM | POA: Diagnosis not present

## 2024-01-08 LAB — POCT GLYCOSYLATED HEMOGLOBIN (HGB A1C): Hemoglobin A1C: 5.7 % — AB (ref 4.0–5.6)

## 2024-01-08 MED ORDER — AMOXICILLIN-POT CLAVULANATE 875-125 MG PO TABS: 1.0000 | ORAL_TABLET | Freq: Two times a day (BID) | ORAL | 0 refills | Status: DC

## 2024-01-08 NOTE — Progress Notes (Signed)
 Established Patient Office Visit  Subjective:      CC:  Chief Complaint  Patient presents with   Acute Visit    Possible URI >> reports sinus pain/pressure, cough and headache x1 week.    HPI: Jasmine Wu is a 53 y.o. female presenting on 01/08/2024 for Acute Visit (Possible URI >> reports sinus pain/pressure, cough and headache x1 week.) .  Discussed the use of AI scribe software for clinical note transcription with the patient, who gave verbal consent to proceed.  History of Present Illness Jasmine Wu is a 53 year old female who presents with sinus pressure and fatigue.  She has been experiencing sinus pressure and fatigue for approximately eleven days. The sinus pressure manifests as a severe headache with facial pressure, particularly below the eyes and across the forehead, worsening when leaning forward. Additional symptoms include congestion, chills, and night sweats. No ear pain or significant coughing, except for morning expectoration due to drainage.  She has been using over-the-counter medications such as Mucinex, Tylenol for headaches, and Afrin nasal spray for more than three days. She takes Singulair for allergies but is not on other allergy medications like Zyrtec.  She has a history of elevated platelets, with recent lab work showing a decrease from 449 to 404. A workup for elevated platelets included a lactate dehydrogenase level of 108. She experiences muscle soreness, bone pain, and extreme fatigue, sometimes feeling extremely tired, almost to the point of falling asleep midday at work.  She started vitamin D supplementation due to low levels and takes B12 daily, although her levels remain in the 300s. She recently transitioned from Paxil to sertraline 100 mg due to weight concerns, initially feeling a little weird but now feels no different. She has been on Ozempic for weight management.  She underwent a sleep study and report was pulled today in office.    Signs and symptoms or probable OSA: excessive daytime fatigue  There is a family history of sleep apnea, with both her father and husband affected. She experiences muscle cramps, particularly in her calves, described as charlie horses, but not frequently enough to be concerning.         Social history:  Relevant past medical, surgical, family and social history reviewed and updated as indicated. Interim medical history since our last visit reviewed.  Allergies and medications reviewed and updated.  DATA REVIEWED: CHART IN EPIC     ROS: Negative unless specifically indicated above in HPI.    Current Outpatient Medications:    amoxicillin-clavulanate (AUGMENTIN) 875-125 MG tablet, Take 1 tablet by mouth 2 (two) times daily., Disp: 20 tablet, Rfl: 0   Cholecalciferol 1.25 MG (50000 UT) TABS, Take 1 tablet by mouth once a week., Disp: 12 tablet, Rfl: 0   cyanocobalamin (VITAMIN B12) 1000 MCG tablet, Take 1,000 mcg by mouth daily., Disp: , Rfl:    losartan-hydrochlorothiazide (HYZAAR) 100-25 MG tablet, Take 1 tablet by mouth once daily, Disp: 90 tablet, Rfl: 1   metFORMIN (GLUCOPHAGE) 500 MG tablet, TAKE ONE TABLET BY MOUTH ONCE A DAY WITH BREAKFAST., Disp: 90 tablet, Rfl: 1   montelukast (SINGULAIR) 10 MG tablet, TAKE ONE TABLET (10 MG TOTAL) BY MOUTH AT BEDTIME., Disp: 30 tablet, Rfl: 3   pantoprazole (PROTONIX) 40 MG tablet, TAKE ONE TABLET BY MOUTH DAILY, Disp: 90 tablet, Rfl: 1   Semaglutide, 2 MG/DOSE, (OZEMPIC, 2 MG/DOSE,) 8 MG/3ML SOPN, Inject 2 mg into the skin once a week. for diabetes., Disp: 9 mL, Rfl: 1  sertraline  (ZOLOFT ) 100 MG tablet, Take 1 tablet (100 mg total) by mouth daily., Disp: 30 tablet, Rfl: 0        Objective:        BP 126/84 (BP Location: Right Arm, Patient Position: Sitting, Cuff Size: Large)   Pulse 69   Temp 97.7 F (36.5 C) (Temporal)   Ht 5' 5 (1.651 m)   Wt 259 lb (117.5 kg)   SpO2 98%   BMI 43.10 kg/m   Physical Exam HEENT:  Maxillary sinus tenderness present. Throat with drainage, no pain. CARDIOVASCULAR: Faint heart murmur present.  Wt Readings from Last 3 Encounters:  01/08/24 259 lb (117.5 kg)  12/03/23 264 lb (119.7 kg)  10/18/23 262 lb (118.8 kg)    Physical Exam Vitals reviewed.  Constitutional:      General: She is not in acute distress.    Appearance: Normal appearance. She is normal weight. She is not ill-appearing, toxic-appearing or diaphoretic.  HENT:     Head: Normocephalic.     Right Ear: Tympanic membrane normal.     Left Ear: Tympanic membrane normal.     Nose:     Right Sinus: Maxillary sinus tenderness and frontal sinus tenderness present.     Left Sinus: Maxillary sinus tenderness and frontal sinus tenderness present.     Mouth/Throat:     Mouth: Mucous membranes are dry.     Pharynx: Posterior oropharyngeal erythema and postnasal drip present. No oropharyngeal exudate.     Tonsils: 0 on the right. 0 on the left.  Eyes:     Extraocular Movements: Extraocular movements intact.     Pupils: Pupils are equal, round, and reactive to light.  Cardiovascular:     Rate and Rhythm: Normal rate and regular rhythm.     Pulses: Normal pulses.     Heart sounds: Murmur (very slight) heard.  Pulmonary:     Effort: Pulmonary effort is normal.     Breath sounds: Normal breath sounds.  Musculoskeletal:     Cervical back: Normal range of motion.  Neurological:     General: No focal deficit present.     Mental Status: She is alert and oriented to person, place, and time. Mental status is at baseline.  Psychiatric:        Mood and Affect: Mood normal.        Behavior: Behavior normal.        Thought Content: Thought content normal.        Judgment: Judgment normal.     Wt Readings from Last 3 Encounters:  01/08/24 259 lb (117.5 kg)  12/03/23 264 lb (119.7 kg)  10/18/23 262 lb (118.8 kg)        Results LABS A1c: 6.5 (06/03/2023) PLT: 404 (12/11/2023) LDH: 108 Total Iron:  44 Ferritin: 38 MCH: low MCHC: low  DIAGNOSTIC Sleep Study: Severe sleep apnea; 67 apneas; 26 respiratory effort-related arousals; 6 hypopneas; 207 obstructive events; 203 desaturations; baseline oxygen saturation 98%; lowest oxygen saturation 74%; 251 minutes at or below 88% oxygen saturation; 235 snores per hour (12/28/2023)  Assessment & Plan:   Assessment and Plan Assessment & Plan Acute maxillary sinusitis Acute maxillary sinusitis with symptoms of sinus pressure, headache, and congestion for approximately 11 days. No ear pain or significant cough. Afrin nasal spray has been used for more than three days, which may cause rebound congestion. - Prescribe Augmentin  for sinusitis. - Advise discontinuation of Afrin nasal spray. - Recommend starting Flonase  to manage nasal congestion.  Severe  obstructive sleep apnea Severe obstructive sleep apnea confirmed by sleep study. Symptoms include severe fatigue and daytime sleepiness. Oxygen saturation dropped to 74% during sleep study, with significant apneas and hypopneas recorded. Baseline oxygen saturation was 98%, but dropped to 74% during episodes, with 251 minutes spent below 88%. - Order CPAP machine through [facility]. - Ensure timely setup and follow-up for CPAP therapy. Discussed results with patient CPAP supplies needed: mask of choice, headgear, cushions, filters, climate control tubing and water chamber. Consider heated humidity. Length of Need: Lifetime Date of face-to-face encounter: Settings: auto 5-20 cm h2O  Discussed as well lifestyle measures to implement including exercise, working on diet, and weight loss would be helpful  Advised pt to f/u within 90 days to assess response to cpap interventions  Iron deficiency anemia, mild Mild iron deficiency anemia suggested by lab work. Total iron is 44 (normal 42-145), and ferritin is 38 (normal 10-291). MCH and MCHC are on the lower end, indicating possible iron deficiency.  Reports fatigue and occasional muscle cramps, which may be related to anemia. - Recommend taking Slow FE iron supplement every other day.  Thrombocytosis Mildly elevated platelet count noted over the last two years, with recent improvement from 449 to 404. Workup for elevated platelets was mostly unremarkable, except for low lactate dehydrogenase, which is not overly concerning.   Type 2 diabetes mellitus without complication No recent A1c test was conducted. Last A1c was 6.5% in January. - Perform finger stick to check current blood glucose level. A1c 5.6 with decrease from prior.   Adjustment disorder with mixed anxiety and depressed mood Transitioned from Paxil to sertraline 100 mg has been well-tolerated with no significant changes in mood or anxiety reported.  Vitamin D deficiency Vitamin D deficiency previously identified and supplementation started. Reports improvement in symptoms since starting supplementation.  Fatigue Severe fatigue likely related to severe obstructive sleep apnea and mild iron deficiency anemia. B12 levels are low despite supplementation. Reports feeling extremely tired, almost to the point of falling asleep midday at work. - Continue B12 supplementation. - Ensure CPAP therapy is initiated for sleep apnea.  Heart murmur Slight heart murmur detected during examination, possibly related to current illness. No immediate concern, but will monitor in future visits. No symptoms related to the murmur. - Re-evaluate heart murmur in future visits.  Recording duration: 23 minutes        Return in about 2 months (around 03/23/2024) for f/u cpap .     Ginger Patrick, MSN, APRN, FNP-C  [facility]

## 2024-01-09 ENCOUNTER — Other Ambulatory Visit: Payer: Self-pay | Admitting: Family

## 2024-01-09 DIAGNOSIS — F4323 Adjustment disorder with mixed anxiety and depressed mood: Secondary | ICD-10-CM

## 2024-01-12 ENCOUNTER — Encounter: Payer: Self-pay | Admitting: Family

## 2024-01-12 DIAGNOSIS — J0100 Acute maxillary sinusitis, unspecified: Secondary | ICD-10-CM

## 2024-01-12 MED ORDER — DOXYCYCLINE HYCLATE 100 MG PO TABS: 100.0000 mg | ORAL_TABLET | Freq: Two times a day (BID) | ORAL | 0 refills | Status: AC

## 2024-01-13 ENCOUNTER — Encounter: Payer: Self-pay | Admitting: Family

## 2024-01-13 ENCOUNTER — Telehealth: Payer: Self-pay | Admitting: Family

## 2024-01-13 NOTE — Telephone Encounter (Signed)
 There is a MyChart message open on this same matter.

## 2024-01-13 NOTE — Telephone Encounter (Signed)
 Copied from CRM #[id number]. Topic: Clinical - Order For Equipment >> Jan 13, 2024 11:25 AM [name] wrote: Reason for CRM: Pt states that she was seen at clinic last Friday and was told that a cpap machine would be ordered for her to [facility], an [facility] in [place] and instead it got sent to [facility] at [address], [place], [place] [address], pt would like know if she would have to call and cal this order at [facility] since the place at [facility] is closer to her, please call her back at [phone] to discuss.

## 2024-01-14 ENCOUNTER — Ambulatory Visit: Payer: Self-pay | Admitting: Family

## 2024-01-14 NOTE — Progress Notes (Signed)
 noted

## 2024-01-15 ENCOUNTER — Encounter: Payer: Self-pay | Admitting: Family

## 2024-01-15 DIAGNOSIS — G4733 Obstructive sleep apnea (adult) (pediatric): Secondary | ICD-10-CM

## 2024-01-15 DIAGNOSIS — E119 Type 2 diabetes mellitus without complications: Secondary | ICD-10-CM

## 2024-01-18 ENCOUNTER — Encounter (INDEPENDENT_AMBULATORY_CARE_PROVIDER_SITE_OTHER): Payer: Self-pay | Admitting: Family

## 2024-01-18 DIAGNOSIS — G4452 New daily persistent headache (NDPH): Secondary | ICD-10-CM

## 2024-01-18 DIAGNOSIS — G4733 Obstructive sleep apnea (adult) (pediatric): Secondary | ICD-10-CM | POA: Insufficient documentation

## 2024-01-18 MED ORDER — TIRZEPATIDE 2.5 MG/0.5ML ~~LOC~~ SOAJ: SUBCUTANEOUS | 0 refills | Status: DC

## 2024-01-18 MED ORDER — TIRZEPATIDE 5 MG/0.5ML ~~LOC~~ SOAJ: SUBCUTANEOUS | 2 refills | Status: DC

## 2024-01-18 NOTE — Telephone Encounter (Signed)
Please see the MyChart message reply(ies) for my assessment and plan.  The patient gave consent for this Medical Advice Message and is aware that it may result in a bill to their insurance company as well as the possibility that this may result in a co-payment or deductible. They are an established patient, but are not seeking medical advice exclusively about a problem treated during an in person or video visit in the last 7 days. I did not recommend an in person or video visit within 7 days of my reply.  I spent a total of 10 minutes cumulative time within 7 days through [employer] Mort Sawyers, FNP

## 2024-01-19 DIAGNOSIS — G4452 New daily persistent headache (NDPH): Secondary | ICD-10-CM

## 2024-01-20 DIAGNOSIS — G4452 New daily persistent headache (NDPH): Secondary | ICD-10-CM | POA: Insufficient documentation

## 2024-01-20 NOTE — Telephone Encounter (Signed)

## 2024-02-01 DIAGNOSIS — G4733 Obstructive sleep apnea (adult) (pediatric): Secondary | ICD-10-CM | POA: Diagnosis not present

## 2024-02-04 ENCOUNTER — Other Ambulatory Visit: Payer: Self-pay | Admitting: Family

## 2024-02-04 DIAGNOSIS — J301 Allergic rhinitis due to pollen: Secondary | ICD-10-CM

## 2024-02-14 NOTE — Progress Notes (Addendum)
 [facility]min[facility] Wu Initial Consultation   Referring Provider Corwin Antu, FNP [address] [place],  [place] [address]  Primary Care Provider Corwin Antu, FNP  Patient Profile: Jasmine Wu is a 53 y.o. female who is seen in consultation in the [facility] at the request of Dr. Dugal for evaluation and management of the problem(s) noted below.  Problem List: Chronic constipation Colorectal cancer screening Family history of colon polyps Hiatal hernia Possible fatty liver   History of Present Illness   Jasmine Wu is a 53 y.o. female   Discussed the use of AI scribe software for clinical note transcription with the patient, who gave verbal consent to proceed.  History of Present Illness Jasmine Wu is a 53 year old female withwith a history of HTN, HLD, T2DM, OSA and Bell's palsy who presents to the Wu office for evaluation of chronic constipation and to discuss colorectal cancer screening  Chronic constipation - Longstanding issues with constipation since childhood - Notes that symptoms are now worsening even before starting GLP-1 medication - Bowel movements occur approximately once per week, requiring use of laxatives. - Stools are hard, dry, and lumpy. - Frequent sensation of incomplete evacuation. - Anal and abdominal pain present. - Occasional blood and mucus in stools. - Endorses associated gas and bloating as well as change in stool odor - No diarrhea or incontinence.  - History of four pregnancies: two vaginal deliveries and two cesarean sections. - Reports having tears and episiotomies with vaginal childbirth - History of vaginal hysterectomy. - Discussed possible component of pelvic floor dysfunction in addition to slow transit constipation  - Significant impact on quality of life, limiting participation in family activities.  - Current bowel regimen includes Colace three times daily, combination stool  softener and stimulant laxative on weekends, and frequent use of enemas. - Miralax ineffective unless combined with stimulant laxative.  - Currently using Mounjaro ; previously used Ozempic . - 60-pound weight loss since January with lifestyle modifications.  - No previous evaluation for history of chronic constipation - No history of celiac disease or thyroid  disease   Colorectal cancer screening and family history - Colonoscopy five years ago showed no polyps or abnormalities. - Family history of polyps in both parents and sister. - No family history of colon cancer.  Additional gastrointestinal issues - Past medical history contains notation of hiatal hernia and fatty liver - No reports or imaging available to corroborate these diagnoses at this time - Can review in further detail at a future visit   GI Review of Symptoms Significant for chronic constipation. Otherwise negative.  General Review of Systems  Review of systems is significant for the pertinent positives and negatives as listed per the HPI.  Full ROS is otherwise negative.  Past Medical History   Past Medical History:  Diagnosis Date   Allergy    Anemia    Diabetes (HCC)    Fatty liver    GERD (gastroesophageal reflux disease)    Hiatal hernia    Hypertension    Lumbar radiculopathy, chronic 05/05/2019   Obesity    Sleep apnea    Spinal stenosis, lumbar region, with neurogenic claudication 04/08/2019     Past Surgical History   Past Surgical History:  Procedure Laterality Date   ABDOMINAL HYSTERECTOMY  2019   one ovary left    CESAREAN SECTION  2000   CESAREAN SECTION  2003   HAND SURGERY     capal tunnell  2012  right  Allergies and Medications   Allergies  Allergen Reactions   Augmentin  [Amoxicillin -Pot Clavulanate] Nausea And Vomiting    Current Meds  Medication Sig   bisacodyl (DULCOLAX) 5 MG EC tablet Take 5 mg by mouth daily as needed for severe constipation.   Cholecalciferol   1.25 MG (50000 UT) TABS Take 1 tablet by mouth once a week.   cyanocobalamin  (VITAMIN B12) 1000 MCG tablet Take 1,000 mcg by mouth daily.   docusate sodium (COLACE) 100 MG capsule Take 100 mg by mouth 2 (two) times daily.   Ferrous Sulfate (SLOW FE PO) Take 1 each by mouth every other day.   linaclotide  (LINZESS ) 145 MCG CAPS capsule Take 1 capsule daily.   losartan -hydrochlorothiazide (HYZAAR) 100-25 MG tablet Take 1 tablet by mouth once daily   metFORMIN  (GLUCOPHAGE ) 500 MG tablet TAKE ONE TABLET BY MOUTH ONCE A DAY WITH BREAKFAST.   montelukast  (SINGULAIR ) 10 MG tablet TAKE ONE TABLET (10 MG TOTAL) BY MOUTH AT BEDTIME.   [EXPIRED] Na Sulfate-K Sulfate-Mg Sulfate concentrate (SUPREP) 17.5-3.13-1.6 GM/177ML SOLN Take 1 kit (354 mLs total) by mouth once for 1 dose.   pantoprazole  (PROTONIX ) 40 MG tablet TAKE ONE TABLET BY MOUTH DAILY   sertraline  (ZOLOFT ) 100 MG tablet Take 1 tablet by mouth once daily   tirzepatide  (MOUNJARO ) 2.5 MG/0.5ML Pen Inject 2.5 mg [facility] once weekly for four weeks, then increase to 5.0 mg [facility] weekly (new prescription)   tirzepatide  (MOUNJARO ) 5 MG/0.5ML Pen After completing four weeks of 2.5 mg qweek [place], start 5.0 mg weekly qweekly     Family History   Family History  Problem Relation Age of Onset   Osteoporosis Mother    Hyperparathyroidism Mother    Osteoarthritis Mother    Diabetes Father    Heart disease Father    Hypertension Father    Atrial fibrillation Father    Supraventricular tachycardia Father    Heart attack Father        98's   Rheum arthritis Father    AAA (abdominal aortic aneurysm) Sister        not a smoker   Brain cancer Maternal Grandfather    Breast cancer Paternal Grandmother    Diabetes Paternal Grandfather    Emphysema Paternal Grandfather      Social History   Social History   Tobacco Use   Smoking status: Never   Smokeless tobacco: Never  Vaping Use   Vaping status: Never Used  Substance Use Topics   Alcohol use: Not  Currently   Drug use: Not Currently   Jasmine Wu reports that she has never smoked. She has never used smokeless tobacco. She reports that she does not currently use alcohol. She reports that she does not currently use drugs.  Vital Signs and Physical Examination   Vitals:   02/15/24 1413  BP: 102/74  Pulse: 80   Body mass index is 42.25 kg/m. Weight: 250 lb (113.4 kg)  General: Well developed, well nourished, no acute distress Head: Normocephalic and atraumatic Eyes: Sclerae anicteric, EOMI Lungs: Clear throughout to auscultation Heart: Regular rate and rhythm; No murmurs, rubs or bruits Abdomen: Soft, non tender and non distended. No masses, hepatosplenomegaly or hernias noted. Normal Bowel sounds Rectal: Deferred Musculoskeletal: Symmetrical with no gross deformities   Review of Data  The following data was reviewed at the time of this encounter:  Laboratory Studies      Latest Ref Rng & Units 02/15/2024    3:28 PM 12/11/2023    8:40 AM 12/03/2023  1:43 PM  CBC  WBC 4.0 - 10.5 K/uL 8.4  7.9  9.1   Hemoglobin 12.0 - 15.0 g/dL 87.3  87.5  87.3   Hematocrit 36.0 - 46.0 % 38.8  39.7  38.8   Platelets 150.0 - 400.0 K/uL 383.0  404  449.0     No results found for: LIPASE    Latest Ref Rng & Units 02/15/2024    3:28 PM 12/03/2023    1:43 PM 06/03/2023    8:40 AM  CMP  Glucose 70 - 99 mg/dL 89  86  883   BUN 6 - 23 mg/dL 17  19  20    Creatinine 0.40 - 1.20 mg/dL 9.25  9.37  9.32   Sodium 135 - 145 mEq/L 137  138  137   Potassium 3.5 - 5.1 mEq/L 3.7  4.0  4.0   Chloride 96 - 112 mEq/L 100  102  98   CO2 19 - 32 mEq/L 28  27  30    Calcium 8.4 - 10.5 mg/dL 89.4  89.6  89.9   Total Protein 6.0 - 8.3 g/dL 7.3  6.9  6.9   Total Bilirubin 0.2 - 1.2 mg/dL 0.3  0.3  0.6   Alkaline Phos 39 - 117 U/L 73  72  72   AST 0 - 37 U/L 13  8  17    ALT 0 - 35 U/L 17  10  21     Lab Results  Component Value Date   TSH 1.12 02/15/2024   Celiac panel negative  Imaging Studies   None  GI Procedures and Studies  None   Clinical Impression  It is my clinical impression that Jasmine Wu is a 53 y.o. female with;  Chronic constipation Colorectal cancer screening Family history of colon polyps Hiatal hernia Possible fatty liver  Jasmine Wu reports a lifelong history of constipation dating back to childhood.  Over the last few years she has noted worsening symptoms of infrequent bowel movements, difficulty evacuating stool and hard/firm bowel movements.  She has not had previous medical evaluation for her constipation the exception of a screening colonoscopy 5 years ago elsewhere that was reported to be normal.  Reviewed that her constipation may be a combination of slow transit and pelvic floor dyssynergia.  Discussed that would be appropriate to rule out other etiologies of chronic constipation such as thyroid  disease and celiac disease.  Noted that she is on Mounjaro  but also offers that her symptoms began worsening before starting GLP-1 medication.  Given her worsening constipation I think it would be appropriate to perform an updated colonoscopy.  We will schedule this with a 2-day bowel prep.  From a treatment perspective, she has been utilizing over-the-counter medications without benefit.  I have recommended a trial of Linzess  145 mcg orally daily.  Depending upon her response decisions can be made about dose escalation or reduction.  If Linzess  is not effective could consider alternate medication such as Trulance, Amitiza, Motegrity or Ibsrela.  Jasmine Wu's past medical history contains notation of diagnoses of hiatal hernia and fatty liver.  At the time of today's consultation, no documentation was available to corroborate these diagnoses but they certainly may be correct.  Given that the primary focus of today's visit was her constipation and future colorectal cancer screening we did not discuss these in detail and can be reviewed at a future visit.  Plan  Schedule  colonoscopy at [facility] with 2-day bowel prep Labs today: CMP, CBC, thyroid  profile, celiac panel  Trial of Linzess  145 mcg orally daily. Pending colonoscopy results and response to Linzess  further decisions can be made as to whether or not she would benefit from pelvic floor physical therapy. At future visit discuss history related to hiatal hernia and fatty liver as documented on past medical history.  Planned Follow Up 2-3 months  The patient or caregiver verbalized understanding of the material covered, with no barriers to understanding. All questions were answered. Patient or caregiver is agreeable with the plan outlined above.    It was a pleasure to see Jasmine Wu.  If you have any questions or concerns regarding this evaluation, do not hesitate to contact me.  Inocente Hausen, MD Jasmine Wu   I spent total of 45 minutes in both face-to-face (25 minutes interview) and non-face-to-face (20 minutes chart review, care coordination, documentation)  activities, excluding procedures performed, for the visit on the date of this encounter.  ADDENDUM: Previous Wu records obtained and reviewed from [place], [place]   Colonoscopy 08/02/2018 Diverticula in the transverse and sigmoid colon Internal and external hemorrhoids  EGD 08/04/2016 Z-line regular and found at 37 cm from the incisors LA grade B esophagitis 2 cm hiatal hernia For 4 to 5 mm sessile polyps in gastric fundus Normal duodenum  Pathology: Unremarkable squamous mucosa, gastric type mucosa with mild chronic inflammation, no Barrett's esophagus Fundic gland polyps without H. Pylori Normal duodenal mucosa without celiac disease  Review of clinic notes document that patient had stool studies performed for infectious pathogens in 2018 for symptoms of diarrhea that were negative.  Clinic note also documents history of fatty liver likely MASLD with advice provided for diet and weight loss.

## 2024-02-15 ENCOUNTER — Encounter: Payer: Self-pay | Admitting: Pediatrics

## 2024-02-15 ENCOUNTER — Ambulatory Visit (INDEPENDENT_AMBULATORY_CARE_PROVIDER_SITE_OTHER): Admitting: Pediatrics

## 2024-02-15 ENCOUNTER — Other Ambulatory Visit (INDEPENDENT_AMBULATORY_CARE_PROVIDER_SITE_OTHER)

## 2024-02-15 VITALS — BP 102/74 | HR 80 | Ht 64.5 in | Wt 250.0 lb

## 2024-02-15 DIAGNOSIS — K5900 Constipation, unspecified: Secondary | ICD-10-CM

## 2024-02-15 DIAGNOSIS — Z83719 Family history of colon polyps, unspecified: Secondary | ICD-10-CM

## 2024-02-15 DIAGNOSIS — K5909 Other constipation: Secondary | ICD-10-CM | POA: Diagnosis not present

## 2024-02-15 DIAGNOSIS — M791 Myalgia, unspecified site: Secondary | ICD-10-CM

## 2024-02-15 LAB — CBC WITH DIFFERENTIAL/PLATELET
Basophils Absolute: 0.1 K/uL (ref 0.0–0.1)
Basophils Relative: 0.7 % (ref 0.0–3.0)
Eosinophils Absolute: 0.3 K/uL (ref 0.0–0.7)
Eosinophils Relative: 3.1 % (ref 0.0–5.0)
HCT: 38.8 % (ref 36.0–46.0)
Hemoglobin: 12.6 g/dL (ref 12.0–15.0)
Lymphocytes Relative: 27.5 % (ref 12.0–46.0)
Lymphs Abs: 2.3 K/uL (ref 0.7–4.0)
MCHC: 32.4 g/dL (ref 30.0–36.0)
MCV: 80.1 fl (ref 78.0–100.0)
Monocytes Absolute: 0.5 K/uL (ref 0.1–1.0)
Monocytes Relative: 6 % (ref 3.0–12.0)
Neutro Abs: 5.2 K/uL (ref 1.4–7.7)
Neutrophils Relative %: 62.7 % (ref 43.0–77.0)
Platelets: 383 K/uL (ref 150.0–400.0)
RBC: 4.85 Mil/uL (ref 3.87–5.11)
RDW: 14.2 % (ref 11.5–15.5)
WBC: 8.4 K/uL (ref 4.0–10.5)

## 2024-02-15 LAB — TSH: TSH: 1.12 u[IU]/mL (ref 0.35–5.50)

## 2024-02-15 LAB — COMPREHENSIVE METABOLIC PANEL WITH GFR
ALT: 17 U/L (ref 0–35)
AST: 13 U/L (ref 0–37)
Albumin: 4.4 g/dL (ref 3.5–5.2)
Alkaline Phosphatase: 73 U/L (ref 39–117)
BUN: 17 mg/dL (ref 6–23)
CO2: 28 meq/L (ref 19–32)
Calcium: 10.5 mg/dL (ref 8.4–10.5)
Chloride: 100 meq/L (ref 96–112)
Creatinine, Ser: 0.74 mg/dL (ref 0.40–1.20)
GFR: 91.13 mL/min (ref >60.00)
Glucose, Bld: 89 mg/dL (ref 70–99)
Potassium: 3.7 meq/L (ref 3.5–5.1)
Sodium: 137 meq/L (ref 135–145)
Total Bilirubin: 0.3 mg/dL (ref 0.2–1.2)
Total Protein: 7.3 g/dL (ref 6.0–8.3)

## 2024-02-15 LAB — CK: Total CK: 66 U/L (ref 17–177)

## 2024-02-15 MED ORDER — NA SULFATE-K SULFATE-MG SULF 17.5-3.13-1.6 GM/177ML PO SOLN: 1.0000 | Freq: Once | ORAL | 0 refills | Status: AC

## 2024-02-15 MED ORDER — LINACLOTIDE 145 MCG PO CAPS: ORAL_CAPSULE | ORAL | 0 refills | Status: DC

## 2024-02-15 NOTE — Patient Instructions (Signed)
 Your provider has requested that you go to the basement level for lab work before leaving today. Press B on the elevator. The lab is located at the first door on the left as you exit the elevator.  Due to recent changes in healthcare laws, you may see the results of your imaging and laboratory studies on MyChart before your provider has had a chance to review them.  We understand that in some cases there may be results that are confusing or concerning to you. Not all laboratory results come back in the same time frame and the provider may be waiting for multiple results in order to interpret others.  Please give us  48 hours in order for your provider to thoroughly review all the results before contacting the office for clarification of your results.   We have given you samples of the following medication to take: Linzess  145 mcg, take one capsule daily before breakfast.  You have been scheduled for a colonoscopy. Please follow written instructions given to you at your visit today.   If you use inhalers (even only as needed), please bring them with you on the day of your procedure.  DO NOT TAKE 7 DAYS PRIOR TO TEST- Trulicity (dulaglutide) Ozempic , Wegovy  (semaglutide ) Mounjaro  (tirzepatide ) Bydureon Bcise (exanatide extended release)  DO NOT TAKE 1 DAY PRIOR TO YOUR TEST Rybelsus  (semaglutide ) Adlyxin (lixisenatide) Victoza (liraglutide) Byetta (exanatide) ___________________________________________________________________________   Thank you for entrusting me with your care and for choosing [facility], Dr. Inocente Hausen  _______________________________________________________  If your blood pressure at your visit was 140/90 or greater, please contact your primary care physician to follow up on this.  _______________________________________________________  If you are age 76 or older, your body mass index should be between 23-30. Your Body mass index is 42.25 kg/m. If  this is out of the aforementioned range listed, please consider follow up with your Primary Care Provider.  If you are age 72 or younger, your body mass index should be between 19-25. Your Body mass index is 42.25 kg/m. If this is out of the aformentioned range listed, please consider follow up with your Primary Care Provider.   ________________________________________________________  The [facility] providers would like to encourage you to use MYCHART to communicate with providers for non-urgent requests or questions.  Due to long hold times on the telephone, sending your provider a message by [facility] may be a faster and more efficient way to get a response.  Please allow 48 business hours for a response.  Please remember that this is for non-urgent requests.  _______________________________________________________  [facility] is using a team-based approach to care.  Your team is made up of your doctor and two to three APPS. Our APPS (Nurse Practitioners and Physician Assistants) work with your physician to ensure care continuity for you. They are fully qualified to address your health concerns and develop a treatment plan. They communicate directly with your gastroenterologist to care for you. Seeing the Advanced Practice Practitioners on your physician's team can help you by facilitating care more promptly, often allowing for earlier appointments, access to diagnostic testing, procedures, and other specialty referrals.

## 2024-02-16 ENCOUNTER — Ambulatory Visit: Payer: Self-pay | Admitting: Family

## 2024-02-16 ENCOUNTER — Ambulatory Visit: Payer: Self-pay | Admitting: Pediatrics

## 2024-02-16 LAB — TISSUE TRANSGLUTAMINASE ABS,IGG,IGA
(tTG) Ab, IgA: <1 U/mL
(tTG) Ab, IgG: <1 U/mL

## 2024-02-16 LAB — IGA: Immunoglobulin A: 336 mg/dL — ABNORMAL HIGH (ref 47–310)

## 2024-02-23 ENCOUNTER — Telehealth: Payer: Self-pay | Admitting: Pediatrics

## 2024-02-23 NOTE — Telephone Encounter (Signed)
 Please see note below.

## 2024-02-23 NOTE — Telephone Encounter (Signed)
 Inbound call from patient stating that she has been talking with Dr. Suzann on Mychart and she wanted patient to call to give us  her pharmacy information that way we can send in the high dose of Linzess .  [facility] uses [facility] in [place], their phone number is [phone].

## 2024-02-24 ENCOUNTER — Other Ambulatory Visit: Payer: Self-pay

## 2024-02-24 MED ORDER — LINACLOTIDE 290 MCG PO CAPS: 290.0000 ug | ORAL_CAPSULE | Freq: Every day | ORAL | 1 refills | Status: DC

## 2024-02-24 NOTE — Telephone Encounter (Signed)
Prescription sent to pharmacy Pt made aware Pt verbalized understanding with all questions answered.   

## 2024-03-01 ENCOUNTER — Other Ambulatory Visit: Payer: Self-pay | Admitting: Family

## 2024-03-01 ENCOUNTER — Encounter: Payer: Self-pay | Admitting: Family

## 2024-03-01 DIAGNOSIS — G4733 Obstructive sleep apnea (adult) (pediatric): Secondary | ICD-10-CM

## 2024-03-02 ENCOUNTER — Other Ambulatory Visit: Payer: Self-pay | Admitting: Gastroenterology

## 2024-03-02 DIAGNOSIS — Z136 Encounter for screening for cardiovascular disorders: Secondary | ICD-10-CM

## 2024-03-02 NOTE — Progress Notes (Signed)
 Order placed for CT cardiac scoring per [place] screening protocol.

## 2024-03-09 ENCOUNTER — Telehealth: Payer: Self-pay

## 2024-03-09 NOTE — Telephone Encounter (Signed)
 I spoke to [facility] and advised her that I needed to change her procedure date.  We scheduled 10/24 at 10:30 am.  Her previous date was 10/28 @ 1:00 pm.  We reviewed her prep instructions and she is aware that she will start her 2 day prep on 10/22.

## 2024-03-10 ENCOUNTER — Ambulatory Visit
Admission: RE | Admit: 2024-03-10 | Discharge: 2024-03-10 | Disposition: A | Discharge status: Home / Self Care | Payer: Self-pay | Source: Ambulatory Visit | Attending: Gastroenterology | Admitting: Gastroenterology

## 2024-03-10 ENCOUNTER — Other Ambulatory Visit: Payer: Self-pay | Admitting: Family

## 2024-03-10 DIAGNOSIS — I10 Essential (primary) hypertension: Secondary | ICD-10-CM

## 2024-03-11 ENCOUNTER — Encounter: Payer: Self-pay | Admitting: Pediatrics

## 2024-03-15 ENCOUNTER — Ambulatory Visit: Admitting: Sleep Medicine

## 2024-03-16 NOTE — Progress Notes (Unsigned)
 [facility] History and Physical   Primary Care Physician:  Corwin Antu, FNP   Reason for Procedure:  Colorectal cancer screening  Plan:    Screening colonoscopy     HPI: Jasmine Wu is a 53 y.o. female undergoing screening colonoscopy for colorectal cancer screening.  Patient's last colonoscopy was performed in 2020 and was normal showing only diverticuli as well as internal/external hemorrhoids.  There is a pertinent family history of colon polyps in the patient's mother, father and sister.  Patient has struggled with chronic constipation since childhood.  Moves her bowels once a week.  Often has a sense of incomplete evacuation.  Iron function and celiac panel normal.     Past Medical History:  Diagnosis Date   Allergy    Anemia    Diabetes (HCC)    Fatty liver    GERD (gastroesophageal reflux disease)    Hiatal hernia    Hypertension    Lumbar radiculopathy, chronic 05/05/2019   Obesity    Sleep apnea    Spinal stenosis, lumbar region, with neurogenic claudication 04/08/2019    Past Surgical History:  Procedure Laterality Date   ABDOMINAL HYSTERECTOMY  2019   one ovary left    CESAREAN SECTION  2000   CESAREAN SECTION  2003   HAND SURGERY     capal tunnell  2012  right    Prior to Admission medications   Medication Sig Start Date End Date Taking? Authorizing Provider  bisacodyl (DULCOLAX) 5 MG EC tablet Take 5 mg by mouth daily as needed for severe constipation.    [provider]  Cholecalciferol  1.25 MG (50000 UT) TABS Take 1 tablet by mouth once a week. 12/16/23   Corwin Antu, FNP  cyanocobalamin  (VITAMIN B12) 1000 MCG tablet Take 1,000 mcg by mouth daily.    [provider]  docusate sodium (COLACE) 100 MG capsule Take 100 mg by mouth 2 (two) times daily.    [provider]  Ferrous Sulfate (SLOW FE PO) Take 1 each by mouth every other day.    [provider]  linaclotide  (LINZESS ) 290 MCG CAPS capsule  Take 1 capsule (290 mcg total) by mouth daily before breakfast. 02/24/24 05/24/24  Iris Tatsch, Inocente HERO, MD  losartan -hydrochlorothiazide (HYZAAR) 100-25 MG tablet Take 1 tablet by mouth once daily 03/10/24   Dugal, Tabitha, FNP  metFORMIN  (GLUCOPHAGE ) 500 MG tablet TAKE ONE TABLET BY MOUTH ONCE A DAY WITH BREAKFAST. 09/21/23   Corwin Antu, FNP  montelukast  (SINGULAIR ) 10 MG tablet TAKE ONE TABLET (10 MG TOTAL) BY MOUTH AT BEDTIME. 02/04/24   Corwin Antu, FNP  pantoprazole  (PROTONIX ) 40 MG tablet TAKE ONE TABLET BY MOUTH DAILY 12/21/23   Dugal, Tabitha, FNP  sertraline  (ZOLOFT ) 100 MG tablet Take 1 tablet by mouth once daily 01/11/24   Dugal, Tabitha, FNP  tirzepatide  (MOUNJARO ) 2.5 MG/0.5ML Pen Inject 2.5 mg [place] once weekly for four weeks, then increase to 5.0 mg [place] weekly (new prescription) 01/18/24   Dugal, Tabitha, FNP  tirzepatide  (MOUNJARO ) 5 MG/0.5ML Pen After completing four weeks of 2.5 mg qweek [place], start 5.0 mg weekly qweekly 02/08/24   Dugal, Tabitha, FNP    Current Outpatient Medications  Medication Sig Dispense Refill   bisacodyl (DULCOLAX) 5 MG EC tablet Take 5 mg by mouth daily as needed for severe constipation.     Cholecalciferol  1.25 MG (50000 UT) TABS Take 1 tablet by mouth once a week. 12 tablet 0   cyanocobalamin  (VITAMIN B12) 1000 MCG tablet Take 1,000  mcg by mouth daily.     docusate sodium (COLACE) 100 MG capsule Take 100 mg by mouth 2 (two) times daily.     Ferrous Sulfate (SLOW FE PO) Take 1 each by mouth every other day.     linaclotide  (LINZESS ) 290 MCG CAPS capsule Take 1 capsule (290 mcg total) by mouth daily before breakfast. 30 capsule 1   losartan -hydrochlorothiazide (HYZAAR) 100-25 MG tablet Take 1 tablet by mouth once daily 90 tablet 1   metFORMIN  (GLUCOPHAGE ) 500 MG tablet TAKE ONE TABLET BY MOUTH ONCE A DAY WITH BREAKFAST. 90 tablet 1   montelukast  (SINGULAIR ) 10 MG tablet TAKE ONE TABLET (10 MG TOTAL) BY MOUTH AT BEDTIME. 30 tablet 3   pantoprazole  (PROTONIX ) 40  MG tablet TAKE ONE TABLET BY MOUTH DAILY 90 tablet 1   sertraline  (ZOLOFT ) 100 MG tablet Take 1 tablet by mouth once daily 30 tablet 2   tirzepatide  (MOUNJARO ) 2.5 MG/0.5ML Pen Inject 2.5 mg [place] once weekly for four weeks, then increase to 5.0 mg [place] weekly (new prescription) 2 mL 0   tirzepatide  (MOUNJARO ) 5 MG/0.5ML Pen After completing four weeks of 2.5 mg qweek [place], start 5.0 mg weekly qweekly 2 mL 2   No current facility-administered medications for this visit.    Allergies as of 03/18/2024 - Review Complete 02/15/2024  Allergen Reaction Noted   Augmentin  [amoxicillin -pot clavulanate] Nausea And Vomiting 01/12/2024    Family History  Problem Relation Age of Onset   Osteoporosis Mother    Hyperparathyroidism Mother    Osteoarthritis Mother    Diabetes Father    Heart disease Father    Hypertension Father    Atrial fibrillation Father    Supraventricular tachycardia Father    Heart attack Father        75's   Rheum arthritis Father    AAA (abdominal aortic aneurysm) Sister        not a smoker   Brain cancer Maternal Grandfather    Breast cancer Paternal Grandmother    Diabetes Paternal Grandfather    Emphysema Paternal Grandfather     Social History   Socioeconomic History   Marital status: Married    Spouse name: Not on file   Number of children: 4   Years of education: Not on file   Highest education level: GED or equivalent  Occupational History    Employer: [employer]  Tobacco Use   Smoking status: Never   Smokeless tobacco: Never  Vaping Use   Vaping status: Never Used  Substance and Sexual Activity   Alcohol use: Not Currently   Drug use: Not Currently   Sexual activity: Yes    Partners: Male    Birth control/protection: Surgical  Other Topics Concern   Not on file  Social History Narrative   Not on file   Social Drivers of Health   Financial Resource Strain: Low Risk  (12/02/2023)   Overall Financial Resource Strain (CARDIA)    Difficulty  of Paying Living Expenses: Not very hard  Food Insecurity: No Food Insecurity (12/02/2023)   Hunger Vital Sign    Worried About Running Out of Food in the Last Year: Never true    Ran Out of Food in the Last Year: Never true  Transportation Needs: No Transportation Needs (12/02/2023)   PRAPARE - Administrator, Civil Service (Medical): No    Lack of Transportation (Non-Medical): No  Physical Activity: Insufficiently Active (12/02/2023)   Exercise Vital Sign    Days  of Exercise per Week: 2 days    Minutes of Exercise per Session: 20 min  Stress: No Stress Concern Present (12/02/2023)   [employer] of Occupational Health - Occupational Stress Questionnaire    Feeling of Stress: Only a little  Social Connections: Moderately Integrated (12/02/2023)   Social Connection and Isolation Panel    Frequency of Communication with Friends and Family: More than three times a week    Frequency of Social Gatherings with Friends and Family: Once a week    Attends Religious Services: 1 to 4 times per year    Active Member of [employer] or Organizations: No    Attends Engineer, structural: Not on file    Marital Status: Married  Catering manager Violence: Not on file    Review of Systems:  All other review of systems negative except as mentioned in the HPI.  Physical Exam: Vital signs There were no vitals taken for this visit.  General:   Alert,  Well-developed, well-nourished, pleasant and cooperative in NAD Airway:  Mallampati  Lungs:  Clear throughout to auscultation.   Heart:  Regular rate and rhythm; no murmurs, clicks, rubs,  or gallops. Abdomen:  Soft, nontender and nondistended. Normal bowel sounds.   Neuro/Psych:  Normal mood and affect. A and O x 3  Inocente Hausen, MD [facility]

## 2024-03-18 ENCOUNTER — Ambulatory Visit: Admitting: Pediatrics

## 2024-03-18 ENCOUNTER — Encounter: Payer: Self-pay | Admitting: Pediatrics

## 2024-03-18 VITALS — BP 106/54 | HR 73 | Temp 98.2°F | Resp 15 | Ht 64.5 in | Wt 250.0 lb

## 2024-03-18 DIAGNOSIS — K649 Unspecified hemorrhoids: Secondary | ICD-10-CM

## 2024-03-18 DIAGNOSIS — K648 Other hemorrhoids: Secondary | ICD-10-CM

## 2024-03-18 DIAGNOSIS — Z83719 Family history of colon polyps, unspecified: Secondary | ICD-10-CM | POA: Diagnosis not present

## 2024-03-18 DIAGNOSIS — D124 Benign neoplasm of descending colon: Secondary | ICD-10-CM | POA: Diagnosis not present

## 2024-03-18 DIAGNOSIS — K5900 Constipation, unspecified: Secondary | ICD-10-CM

## 2024-03-18 DIAGNOSIS — K5730 Diverticulosis of large intestine without perforation or abscess without bleeding: Secondary | ICD-10-CM | POA: Diagnosis not present

## 2024-03-18 DIAGNOSIS — Z1211 Encounter for screening for malignant neoplasm of colon: Secondary | ICD-10-CM

## 2024-03-18 MED ORDER — SODIUM CHLORIDE 0.9 % IV SOLN: 500.0000 mL | Freq: Once | INTRAVENOUS | Status: AC

## 2024-03-18 NOTE — Progress Notes (Signed)
 Report to PACU, RN, vss, BBS= Clear.

## 2024-03-18 NOTE — Patient Instructions (Signed)
 YOU HAD AN ENDOSCOPIC PROCEDURE TODAY AT THE [facility]:   Refer to the procedure report that was given to you for any specific questions about what was found during the examination.  If the procedure report does not answer your questions, please call your gastroenterologist to clarify.  If you requested that your care partner not be given the details of your procedure findings, then the procedure report has been included in a sealed envelope for you to review at your convenience later.  YOU SHOULD EXPECT: Some feelings of bloating in the abdomen. Passage of more gas than usual.  Walking can help get rid of the air that was put into your GI tract during the procedure and reduce the bloating. If you had a lower endoscopy (such as a colonoscopy or flexible sigmoidoscopy) you may notice spotting of blood in your stool or on the toilet paper. If you underwent a bowel prep for your procedure, you may not have a normal bowel movement for a few days.  Please Note:  You might notice some irritation and congestion in your nose or some drainage.  This is from the oxygen used during your procedure.  There is no need for concern and it should clear up in a day or so.  SYMPTOMS TO REPORT IMMEDIATELY:  Following lower endoscopy (colonoscopy or flexible sigmoidoscopy):  Excessive amounts of blood in the stool  Significant tenderness or worsening of abdominal pains  Swelling of the abdomen that is new, acute  Fever of 100F or higher   For urgent or emergent issues, a gastroenterologist can be reached at any hour by calling [phone]. Do not use MyChart messaging for urgent concerns.    DIET:  We do recommend a small meal at first, but then you may proceed to your regular diet.  Drink plenty of fluids but you should avoid alcoholic beverages for 24 hours.  MEDICATIONS: Continue present medications.  FOLLOW UP: Await pathology results. Repeat colonoscopy in 5 years for surveillance given  family history of polyps in 3 first degree relatives.  Educational material given to patient: Polyps, Diverticulosis, Hemorrhoids.  Thank you for allowing us  to provide for your healthcare needs today.  ACTIVITY:  You should plan to take it easy for the rest of today and you should NOT DRIVE or use heavy machinery until tomorrow (because of the sedation medicines used during the test).    FOLLOW UP: Our staff will call the number listed on your records the next business day following your procedure.  We will call around 7:15- 8:00 am to check on you and address any questions or concerns that you may have regarding the information given to you following your procedure. If we do not reach you, we will leave a message.     If any biopsies were taken you will be contacted by phone or by letter within the next 1-3 weeks.  Please call us  at [phone] if you have not heard about the biopsies in 3 weeks.    SIGNATURES/CONFIDENTIALITY: You and/or your care partner have signed paperwork which will be entered into your electronic medical record.  These signatures attest to the fact that that the information above on your After Visit Summary has been reviewed and is understood.  Full responsibility of the confidentiality of this discharge information lies with you and/or your care-partner.

## 2024-03-18 NOTE — Progress Notes (Signed)
 Called to room to assist during endoscopic procedure.  Patient ID and intended procedure confirmed with present staff. Received instructions for my participation in the procedure from the performing physician.

## 2024-03-18 NOTE — Progress Notes (Signed)
 Pt's states no medical or surgical changes since previsit or office visit.

## 2024-03-18 NOTE — Op Note (Signed)
 [facility] Patient Name: Jasmine Wu Procedure Date: 03/18/2024 10:54 AM MRN: [id number] Endoscopist: Inocente Hausen , MD, [id number] Age: 53 Referring MD:  Date of Birth: 1970-12-12 Gender: Female Account #: [id number] Procedure:                Colonoscopy Indications:              Colon cancer screening in patient at increased                            risk: Family history of colon polyps in multiple                            1st-degree relatives, Last colonoscopy: 2020,                            Incidental constipation noted Medicines:                Monitored Anesthesia Care Procedure:                Pre-Anesthesia Assessment:                           - Prior to the procedure, a History and Physical                            was performed, and patient medications and                            allergies were reviewed. The patient's tolerance of                            previous anesthesia was also reviewed. The risks                            and benefits of the procedure and the sedation                            options and risks were discussed with the patient.                            All questions were answered, and informed consent                            was obtained. Prior Anticoagulants: The patient has                            taken no anticoagulant or antiplatelet agents. ASA                            Grade Assessment: III - A patient with severe                            systemic disease. After reviewing the risks and  benefits, the patient was deemed in satisfactory                            condition to undergo the procedure.                           After obtaining informed consent, the colonoscope                            was passed under direct vision. Throughout the                            procedure, the patient's blood pressure, pulse, and                            oxygen saturations were  monitored continuously. The                            Olympus Scope SN: [id number] was introduced through                            the anus and advanced to the cecum, identified by                            appendiceal orifice and ileocecal valve. The                            colonoscopy was performed without difficulty. The                            patient tolerated the procedure well. The quality                            of the bowel preparation was good. The ileocecal                            valve, appendiceal orifice, and rectum were                            photographed. Scope In: 11:02:53 AM Scope Out: 11:22:00 AM Scope Withdrawal Time: 0 hours 13 minutes 34 seconds  Total Procedure Duration: 0 hours 19 minutes 7 seconds  Findings:                 Hemorrhoids were found on perianal exam.                           The digital rectal exam was normal. Pertinent                            negatives include normal sphincter tone and no                            palpable rectal lesions.  Multiple small-mouthed diverticula were found in                            the sigmoid colon.                           A 4 mm polyp was found in the descending colon. The                            polyp was sessile. The polyp was removed with a                            cold biopsy forceps. Resection and retrieval were                            complete.                           Internal hemorrhoids were found during retroflexion. Complications:            No immediate complications. Estimated blood loss:                            Minimal. Estimated Blood Loss:     Estimated blood loss was minimal. Impression:               - Hemorrhoids found on perianal exam.                           - Diverticulosis in the sigmoid colon.                           - One 4 mm polyp in the descending colon, removed                            with a cold biopsy forceps.  Resected and retrieved.                           - Internal hemorrhoids. Recommendation:           - Discharge patient to home (ambulatory).                           - Await pathology results.                           - Repeat colonoscopy in 5 years for surveillance                            given family history of polyps in 3 first degree                            relatives.                           - The findings and recommendations were discussed  with the patient's family.                           - Patient has a contact number available for                            emergencies. The signs and symptoms of potential                            delayed complications were discussed with the                            patient. Return to normal activities tomorrow.                            Written discharge instructions were provided to the                            patient. Inocente Hausen, MD 03/18/2024 11:27:28 AM This report has been signed electronically.

## 2024-03-21 ENCOUNTER — Telehealth: Payer: Self-pay

## 2024-03-21 NOTE — Telephone Encounter (Signed)
  Follow up Call-     03/18/2024   10:39 AM  Call back number  Post procedure Call Back phone  # [phone]  Permission to leave phone message Yes     Patient questions:  Do you have a fever, pain , or abdominal swelling? No. Pain Score  0 *  Have you tolerated food without any problems? Yes.    Have you been able to return to your normal activities? Yes.    Do you have any questions about your discharge instructions: Diet   No. Medications  No. Follow up visit  No.  Do you have questions or concerns about your Care? No.  Actions: * If pain score is 4 or above: No action needed, pain <4.

## 2024-03-23 ENCOUNTER — Ambulatory Visit: Payer: Self-pay | Admitting: Pediatrics

## 2024-03-23 LAB — SURGICAL PATHOLOGY

## 2024-03-28 ENCOUNTER — Ambulatory Visit: Admitting: Sleep Medicine

## 2024-04-05 ENCOUNTER — Ambulatory Visit: Admitting: Sleep Medicine

## 2024-04-07 ENCOUNTER — Other Ambulatory Visit: Payer: Self-pay | Admitting: Family

## 2024-04-07 DIAGNOSIS — E119 Type 2 diabetes mellitus without complications: Secondary | ICD-10-CM

## 2024-04-10 ENCOUNTER — Other Ambulatory Visit: Payer: Self-pay | Admitting: Family

## 2024-04-10 DIAGNOSIS — F4323 Adjustment disorder with mixed anxiety and depressed mood: Secondary | ICD-10-CM

## 2024-04-19 ENCOUNTER — Ambulatory Visit: Admitting: Family Medicine

## 2024-04-19 ENCOUNTER — Encounter: Payer: Self-pay | Admitting: Family Medicine

## 2024-04-19 ENCOUNTER — Ambulatory Visit: Payer: Self-pay | Admitting: Family Medicine

## 2024-04-19 ENCOUNTER — Ambulatory Visit: Payer: Self-pay

## 2024-04-19 VITALS — BP 106/74 | HR 75 | Temp 98.4°F | Ht 64.5 in | Wt 245.4 lb

## 2024-04-19 DIAGNOSIS — R935 Abnormal findings on diagnostic imaging of other abdominal regions, including retroperitoneum: Secondary | ICD-10-CM

## 2024-04-19 DIAGNOSIS — R1011 Right upper quadrant pain: Secondary | ICD-10-CM

## 2024-04-19 LAB — CBC WITH DIFFERENTIAL/PLATELET
Basophils Absolute: 0 K/uL (ref 0.0–0.1)
Basophils Relative: 0.3 % (ref 0.0–3.0)
Eosinophils Absolute: 0.2 K/uL (ref 0.0–0.7)
Eosinophils Relative: 2 % (ref 0.0–5.0)
HCT: 40 % (ref 36.0–46.0)
Hemoglobin: 13.2 g/dL (ref 12.0–15.0)
Lymphocytes Relative: 27.7 % (ref 12.0–46.0)
Lymphs Abs: 2.3 K/uL (ref 0.7–4.0)
MCHC: 33 g/dL (ref 30.0–36.0)
MCV: 80.1 fl (ref 78.0–100.0)
Monocytes Absolute: 0.5 K/uL (ref 0.1–1.0)
Monocytes Relative: 6.2 % (ref 3.0–12.0)
Neutro Abs: 5.4 K/uL (ref 1.4–7.7)
Neutrophils Relative %: 63.8 % (ref 43.0–77.0)
Platelets: 392 K/uL (ref 150.0–400.0)
RBC: 4.99 Mil/uL (ref 3.87–5.11)
RDW: 14.5 % (ref 11.5–15.5)
WBC: 8.4 K/uL (ref 4.0–10.5)

## 2024-04-19 LAB — BASIC METABOLIC PANEL WITH GFR
BUN: 17 mg/dL (ref 6–23)
CO2: 33 meq/L — ABNORMAL HIGH (ref 19–32)
Calcium: 10.3 mg/dL (ref 8.4–10.5)
Chloride: 101 meq/L (ref 96–112)
Creatinine, Ser: 0.7 mg/dL (ref 0.40–1.20)
GFR: 97.29 mL/min (ref >60.00)
Glucose, Bld: 93 mg/dL (ref 70–99)
Potassium: 4.1 meq/L (ref 3.5–5.1)
Sodium: 137 meq/L (ref 135–145)

## 2024-04-19 LAB — HEPATIC FUNCTION PANEL
ALT: 12 U/L (ref 0–35)
AST: 14 U/L (ref 0–37)
Albumin: 4.3 g/dL (ref 3.5–5.2)
Alkaline Phosphatase: 72 U/L (ref 39–117)
Bilirubin, Direct: 0 mg/dL (ref 0.0–0.3)
Total Bilirubin: 0.3 mg/dL (ref 0.2–1.2)
Total Protein: 7.4 g/dL (ref 6.0–8.3)

## 2024-04-19 LAB — LIPASE: Lipase: 17 U/L (ref 11.0–59.0)

## 2024-04-19 NOTE — Telephone Encounter (Signed)
 FYI Only or Action Required?: FYI only for provider: appointment scheduled on today.  Patient was last seen in primary care on 01/08/2024 by Corwin Antu, FNP.  Called Nurse Triage reporting Abdominal Pain.  Symptoms began a week ago.  Interventions attempted: Nothing.  Symptoms are: gradually worsening.  Triage Disposition: See Physician Within 24 Hours  Patient/caregiver understands and will follow disposition?: Yes, will follow disposition  Copied from CRM #[id number]. Topic: Clinical - Red Word Triage >> Apr 19, 2024  8:23 AM Macario HERO wrote: Red Word that prompted transfer to Nurse Triage: Patient stated she's experiencing pain on her right side. Reason for Disposition  [1] MODERATE pain (e.g., interferes with normal activities) AND [2] pain comes and goes (cramps) AND [3] present > 24 hours  (Exception: Pain with Vomiting or Diarrhea - see that Guideline.)  Answer Assessment - Initial Assessment Questions 1. LOCATION: Where does it hurt?      RUQ 2. RADIATION: Does the pain shoot anywhere else? (e.g., chest, back)     Denies radiation 3. ONSET: When did the pain begin? (e.g., minutes, hours or days ago)      Few days, start about 2 days ago 4. SUDDEN: Gradual or sudden onset?     gradual 5. PATTERN Does the pain come and go, or is it constant?     Intermittent, only happens in evening  6. SEVERITY: How bad is the pain?  (e.g., Scale 1-10; mild, moderate, or severe)     8 7. RECURRENT SYMPTOM: Have you ever had this type of stomach pain before? If Yes, ask: When was the last time? and What happened that time?      Never evaluated 8. CAUSE: What do you think is causing the stomach pain? (e.g., gallstones, recent abdominal surgery)     Gallbladder 9. RELIEVING/AGGRAVATING FACTORS: What makes it better or worse? (e.g., antacids, bending or twisting motion, bowel movement)     Denies changes in GU, denies changes in GI 10. OTHER SYMPTOMS: Do you have any  other symptoms? (e.g., back pain, diarrhea, fever, urination pain, vomiting)       Back pain  Protocols used: Abdominal Pain - Female-A-AH

## 2024-04-19 NOTE — Assessment & Plan Note (Signed)
 Intermittent , 2 weeks/ usually at night, sometimes with nausea and vomiting  Refers to right scapula area  Some RUQ and epigastric tenderness on exam  Gallbladder etiology is possible , also gastritis (does take protonix )  In obese female patient taking glp-1   Lab today  Urgent abd us  order  Encouraged to avoid fatty foods or other triggers  ER if symptoms become severe   Call back and Er precautions noted in detail today

## 2024-04-19 NOTE — Progress Notes (Signed)
 Subjective:    Patient ID: Jasmine Wu, female    DOB: May 04, 1971, 53 y.o.   MRN: [id number]  HPI  Wt Readings from Last 3 Encounters:  04/19/24 245 lb 6 oz (111.3 kg)  03/18/24 250 lb (113.4 kg)  02/15/24 250 lb (113.4 kg)   41.47 kg/m  Vitals:   04/19/24 1233  BP: 106/74  Pulse: 75  Temp: 98.4 F (36.9 C)  SpO2: 98%    53 yo pt of [facility] presents with  RUQ abd pain Chills   Pain started about 2 weeks ago  Wakes her up  Worse this week  Achey pain - knawing  sensation at times /not sharp  Radiates to her shoulder blade area on the right   At first episodes lasted 1-2 hours  Last night -much longer   Less during the day than at night   No chance pregnant-had hysterectomy   Wonders if gallbladder   ? If fatty dinner makes this worse  Some nausea and vomiting    (not related to moujaro)   Some issues with constipation usually  More recently - some diarrhea on linzess  (due to that)     PMH notable for  Fatty liver Morbid obesity -lost from 319 on moujaro, down to 245  DM2-takes mounjaro   Takes protonix   GERD Takes iron -just barely iron def    Lab Results  Component Value Date   NA 137 02/15/2024   K 3.7 02/15/2024   CO2 28 02/15/2024   GLUCOSE 89 02/15/2024   BUN 17 02/15/2024   CREATININE 0.74 02/15/2024   CALCIUM 10.5 02/15/2024   GFR 91.13 02/15/2024   EGFR 108 12/12/2020   GFRNONAA >60 01/07/2022   Lab Results  Component Value Date   WBC 8.4 02/15/2024   HGB 12.6 02/15/2024   HCT 38.8 02/15/2024   MCV 80.1 02/15/2024   PLT 383.0 02/15/2024   Lab Results  Component Value Date   IRON 44 12/11/2023   TIBC 354.2 12/11/2023   FERRITIN 38.1 12/11/2023    Lab Results  Component Value Date   ALT 17 02/15/2024   AST 13 02/15/2024   GGT 24 12/12/2020   ALKPHOS 73 02/15/2024   BILITOT 0.3 02/15/2024   Lab Results  Component Value Date   HGBA1C 5.7 (A) 01/08/2024   HGBA1C 6.5 06/03/2023   HGBA1C 6.0 (A) 09/25/2022       Patient Active Problem List   Diagnosis Date Noted   RUQ pain 04/19/2024   New daily persistent headache 01/20/2024   Severe obstructive sleep apnea 01/18/2024   Heart murmur 01/08/2024   High platelet count 12/08/2023   Elevated LDL cholesterol level 12/03/2023   Drug-induced constipation 07/31/2023   Type 2 diabetes mellitus without complication, without long-term current use of insulin (HCC) 06/03/2023   Morbid obesity (HCC) 06/03/2023   Seasonal allergic rhinitis due to pollen 09/25/2022   Vitamin D  deficiency 04/01/2022   Polyarthralgia 04/01/2022   Low serum vitamin B12 04/01/2022   Essential hypertension 07/19/2019   Adjustment disorder with mixed anxiety and depressed mood 07/19/2019   Lumbar radiculopathy, chronic 05/05/2019   Past Medical History:  Diagnosis Date   Allergy 2023   Pollen/animals   Anemia    Anxiety    Arthritis    Depression    Diabetes (HCC)    Fatty liver    GERD (gastroesophageal reflux disease)    Hiatal hernia    Hypertension    Lumbar radiculopathy, chronic 05/05/2019  Obesity    Osteoporosis    Sleep apnea    Spinal stenosis, lumbar region, with neurogenic claudication 04/08/2019   Past Surgical History:  Procedure Laterality Date   ABDOMINAL HYSTERECTOMY  2019   one ovary left    CESAREAN SECTION  2000   CESAREAN SECTION  2003   COLONOSCOPY     HAND SURGERY     capal tunnell  2012  right   TUBAL LIGATION  2019   Social History   Tobacco Use   Smoking status: Never   Smokeless tobacco: Never  Vaping Use   Vaping status: Never Used  Substance Use Topics   Alcohol use: Not Currently   Drug use: Not Currently   Family History  Problem Relation Age of Onset   Colon polyps Mother    Osteoporosis Mother    Hyperparathyroidism Mother    Osteoarthritis Mother    Arthritis Mother    Depression Mother    Diabetes Father    Heart disease Father    Hypertension Father    Atrial fibrillation Father     Supraventricular tachycardia Father    Heart attack Father        66's   Rheum arthritis Father    Arthritis Father    Depression Father    Obesity Father    Varicose Veins Father    AAA (abdominal aortic aneurysm) Sister        not a smoker   Brain cancer Maternal Grandfather    Breast cancer Paternal Grandmother    Diabetes Paternal Grandfather    Emphysema Paternal Grandfather    Colon cancer Neg Hx    Esophageal cancer Neg Hx    Rectal cancer Neg Hx    Stomach cancer Neg Hx    Allergies  Allergen Reactions   Augmentin  [Amoxicillin -Pot Clavulanate] Nausea And Vomiting   Current Outpatient Medications on File Prior to Visit  Medication Sig Dispense Refill   Cholecalciferol  (VITAMIN D3) 50 MCG (2000 UT) CAPS Take 1 capsule by mouth daily.     cyanocobalamin  (VITAMIN B12) 1000 MCG tablet Take 1,000 mcg by mouth daily.     Ferrous Sulfate (SLOW FE PO) Take 1 each by mouth every other day.     linaclotide  (LINZESS ) 290 MCG CAPS capsule Take 1 capsule (290 mcg total) by mouth daily before breakfast. 30 capsule 1   losartan -hydrochlorothiazide (HYZAAR) 100-25 MG tablet Take 1 tablet by mouth once daily 90 tablet 1   metFORMIN  (GLUCOPHAGE ) 500 MG tablet TAKE ONE TABLET BY MOUTH ONCE A DAY WITH BREAKFAST. 90 tablet 1   montelukast  (SINGULAIR ) 10 MG tablet TAKE ONE TABLET (10 MG TOTAL) BY MOUTH AT BEDTIME. 30 tablet 3   pantoprazole  (PROTONIX ) 40 MG tablet TAKE ONE TABLET BY MOUTH DAILY 90 tablet 1   sertraline  (ZOLOFT ) 100 MG tablet Take 1 tablet by mouth once daily 90 tablet 0   tirzepatide  (MOUNJARO ) 5 MG/0.5ML Pen After completing four weeks of 2.5 mg qweek , start 5.0 mg weekly qweekly 2 mL 2   Current Facility-Administered Medications on File Prior to Visit  Medication Dose Route Frequency Provider Last Rate Last Admin   0.9 %  sodium chloride  infusion  500 mL Intravenous Once McGreal, Inocente HERO, MD        Review of Systems  Constitutional:  Positive for chills. Negative for  fatigue and fever.  Respiratory:  Negative for cough and shortness of breath.   Gastrointestinal:  Positive for abdominal pain, constipation, diarrhea, nausea and  vomiting. Negative for abdominal distention, anal bleeding, blood in stool and rectal pain.  Genitourinary:  Negative for dysuria, flank pain and frequency.  Skin:  Negative for color change.       Objective:   Physical Exam Constitutional:      General: She is not in acute distress.    Appearance: Normal appearance. She is well-developed. She is obese. She is not ill-appearing or diaphoretic.  HENT:     Head: Normocephalic and atraumatic.  Eyes:     Conjunctiva/sclera: Conjunctivae normal.     Pupils: Pupils are equal, round, and reactive to light.  Neck:     Thyroid : No thyromegaly.     Vascular: No carotid bruit or JVD.  Cardiovascular:     Rate and Rhythm: Normal rate and regular rhythm.     Heart sounds: Normal heart sounds.     No gallop.  Pulmonary:     Effort: Pulmonary effort is normal. No respiratory distress.     Breath sounds: Normal breath sounds. No wheezing or rales.  Abdominal:     General: Abdomen is protuberant. Bowel sounds are normal. There is no distension or abdominal bruit.     Palpations: Abdomen is soft. There is no shifting dullness, fluid wave, hepatomegaly, splenomegaly, mass or pulsatile mass.     Tenderness: There is abdominal tenderness in the right upper quadrant and epigastric area. There is no right CVA tenderness, left CVA tenderness, guarding or rebound. Positive signs include Murphy's sign.     Hernia: No hernia is present.  Musculoskeletal:     Cervical back: Normal range of motion and neck supple.     Right lower leg: No edema.     Left lower leg: No edema.  Lymphadenopathy:     Cervical: No cervical adenopathy.  Skin:    General: Skin is warm and dry.     Coloration: Skin is not jaundiced or pale.     Findings: No bruising or rash.  Neurological:     Mental Status: She is  alert.     Coordination: Coordination normal.     Deep Tendon Reflexes: Reflexes are normal and symmetric. Reflexes normal.  Psychiatric:        Mood and Affect: Mood normal.           Assessment & Plan:   Problem List Items Addressed This Visit       Other   RUQ pain - Primary   Intermittent , 2 weeks/ usually at night, sometimes with nausea and vomiting  Refers to right scapula area  Some RUQ and epigastric tenderness on exam  Gallbladder etiology is possible , also gastritis (does take protonix )  In obese female patient taking glp-1   Lab today  Urgent abd us  order  Encouraged to avoid fatty foods or other triggers  ER if symptoms become severe   Call back and Er precautions noted in detail today        Relevant Orders   US  Abdomen Complete   Basic metabolic panel with GFR   Hepatic function panel   CBC with Differential/Platelet   Lipase

## 2024-04-19 NOTE — Telephone Encounter (Signed)
 NOTED Thank you for seeing her

## 2024-04-19 NOTE — Patient Instructions (Signed)
 Eat as low fat as possible  If symptoms become severe go to the ER   Lab now   Working on ultrasound order as well

## 2024-04-19 NOTE — Telephone Encounter (Signed)
 Will see patient then Agree with ER and UC precautions

## 2024-04-20 ENCOUNTER — Ambulatory Visit
Admission: RE | Admit: 2024-04-20 | Discharge: 2024-04-20 | Disposition: A | Discharge status: Home / Self Care | Source: Ambulatory Visit | Attending: Family Medicine | Admitting: Family Medicine

## 2024-04-20 DIAGNOSIS — R109 Unspecified abdominal pain: Secondary | ICD-10-CM | POA: Diagnosis not present

## 2024-04-20 DIAGNOSIS — K760 Fatty (change of) liver, not elsewhere classified: Secondary | ICD-10-CM | POA: Diagnosis not present

## 2024-04-20 DIAGNOSIS — R1011 Right upper quadrant pain: Secondary | ICD-10-CM

## 2024-04-21 DIAGNOSIS — R935 Abnormal findings on diagnostic imaging of other abdominal regions, including retroperitoneum: Secondary | ICD-10-CM | POA: Insufficient documentation

## 2024-04-21 NOTE — Telephone Encounter (Signed)
 I put the order in for HIDA  stat  Please let referrals know

## 2024-04-22 ENCOUNTER — Encounter: Payer: Self-pay | Admitting: *Deleted

## 2024-04-25 NOTE — Progress Notes (Signed)
 noted

## 2024-04-26 ENCOUNTER — Other Ambulatory Visit: Payer: Self-pay | Admitting: Pediatrics

## 2024-04-28 ENCOUNTER — Ambulatory Visit: Admission: RE | Admit: 2024-04-28 | Discharge: 2024-04-28 | Attending: Family Medicine | Admitting: Family Medicine

## 2024-04-28 ENCOUNTER — Ambulatory Visit: Payer: Self-pay | Admitting: Family Medicine

## 2024-04-28 DIAGNOSIS — R1011 Right upper quadrant pain: Secondary | ICD-10-CM

## 2024-04-28 DIAGNOSIS — R935 Abnormal findings on diagnostic imaging of other abdominal regions, including retroperitoneum: Secondary | ICD-10-CM | POA: Insufficient documentation

## 2024-04-28 DIAGNOSIS — K829 Disease of gallbladder, unspecified: Secondary | ICD-10-CM

## 2024-04-28 MED ORDER — MORPHINE BOLUS VIA INFUSION
3.0000 mg | Freq: Once | INTRAVENOUS | Status: AC
  Administered 2024-04-28: 3 mg via INTRAVENOUS
  Filled 2024-04-28: qty 3

## 2024-04-28 MED ORDER — TECHNETIUM TC 99M MEBROFENIN IV KIT
5.1100 | PACK | Freq: Once | INTRAVENOUS | Status: AC | PRN
  Administered 2024-04-28: 5.11 via INTRAVENOUS

## 2024-04-28 MED ORDER — MORPHINE SULFATE (PF) 4 MG/ML IV SOLN
INTRAVENOUS | Status: AC
  Filled 2024-04-28: qty 1

## 2024-04-28 NOTE — Telephone Encounter (Signed)
 Pt responded, please review her mychart response

## 2024-04-29 ENCOUNTER — Telehealth: Payer: Self-pay | Admitting: Family

## 2024-04-29 NOTE — Telephone Encounter (Signed)
 Please see result note from 04/28/24.

## 2024-04-29 NOTE — Telephone Encounter (Signed)
 Spoke with pt and she would like to have a referral placed to [facility] in [place].

## 2024-04-29 NOTE — Telephone Encounter (Signed)
 Copied from CRM [id number]. Topic: Referral - Question >> Apr 29, 2024  1:25 PM Jayma L wrote: Reason for CRM: patient called in asking for a update on referral, patient stated she didn't need a referral to schedule. Looking for a update from pcp   Please callback patient at [phone]

## 2024-05-01 DIAGNOSIS — K829 Disease of gallbladder, unspecified: Secondary | ICD-10-CM | POA: Insufficient documentation

## 2024-05-02 ENCOUNTER — Other Ambulatory Visit

## 2024-05-05 ENCOUNTER — Ambulatory Visit: Payer: Self-pay | Admitting: General Surgery

## 2024-05-05 DIAGNOSIS — K828 Other specified diseases of gallbladder: Secondary | ICD-10-CM | POA: Diagnosis not present

## 2024-05-10 ENCOUNTER — Ambulatory Visit: Admitting: Pediatrics

## 2024-05-11 ENCOUNTER — Other Ambulatory Visit: Payer: Self-pay | Admitting: Family

## 2024-05-11 DIAGNOSIS — G4733 Obstructive sleep apnea (adult) (pediatric): Secondary | ICD-10-CM

## 2024-05-11 DIAGNOSIS — E119 Type 2 diabetes mellitus without complications: Secondary | ICD-10-CM

## 2024-05-15 ENCOUNTER — Telehealth: Admitting: Family Medicine

## 2024-05-15 ENCOUNTER — Telehealth

## 2024-05-15 DIAGNOSIS — J40 Bronchitis, not specified as acute or chronic: Secondary | ICD-10-CM

## 2024-05-15 MED ORDER — PREDNISONE 20 MG PO TABS: 20.0000 mg | ORAL_TABLET | Freq: Two times a day (BID) | ORAL | 0 refills | Status: AC

## 2024-05-15 MED ORDER — AZITHROMYCIN 250 MG PO TABS: ORAL_TABLET | ORAL | 0 refills | Status: DC

## 2024-05-15 MED ORDER — AZITHROMYCIN 250 MG PO TABS: ORAL_TABLET | ORAL | 0 refills | Status: AC

## 2024-05-15 NOTE — Patient Instructions (Signed)

## 2024-05-15 NOTE — Progress Notes (Signed)
 " Virtual Visit Consent   Jasmine Wu, you are scheduled for a virtual visit with a [facility] provider today. Just as with appointments in the office, your consent must be obtained to participate. Your consent will be active for this visit and any virtual visit you may have with one of our providers in the next 365 days. If you have a MyChart account, a copy of this consent can be sent to you electronically.  As this is a virtual visit, video technology does not allow for your provider to perform a traditional examination. This may limit your provider's ability to fully assess your condition. If your provider identifies any concerns that need to be evaluated in person or the need to arrange testing (such as labs, EKG, etc.), we will make arrangements to do so. Although advances in technology are sophisticated, we cannot ensure that it will always work on either your end or our end. If the connection with a video visit is poor, the visit may have to be switched to a telephone visit. With either a video or telephone visit, we are not always able to ensure that we have a secure connection.  By engaging in this virtual visit, you consent to the provision of healthcare and authorize for your insurance to be billed (if applicable) for the services provided during this visit. Depending on your insurance coverage, you may receive a charge related to this service.  I need to obtain your verbal consent now. Are you willing to proceed with your visit today? Jasmine Wu has provided verbal consent on 05/15/2024 for a virtual visit (video or telephone). Loa Lamp, FNP  Date: 05/15/2024 3:31 PM   Virtual Visit via Video Note   I, Loa Lamp, connected with  Jasmine Wu  ([id number], Dec 23, 1970) on 05/15/2024 at  3:30 PM EST by a video-enabled telemedicine application and verified that I am speaking with the correct person using two identifiers.  Location: Patient: Virtual Visit Location  Patient: Home Provider: Virtual Visit Location Provider: Home Office   I discussed the limitations of evaluation and management by telemedicine and the availability of in person appointments. The patient expressed understanding and agreed to proceed.    History of Present Illness: Jasmine Wu is a 53 y.o. who identifies as a female who was assigned female at birth, and is being seen today for cough, wheezing, metallic tasting mucus, green mucus, nasal congestion, fever low grade, night sweats for a week. SABRA  HPI: HPI  Problems:  Patient Active Problem List   Diagnosis Date Noted   Gallbladder disorder 05/01/2024   Abnormal ultrasound of abdomen 04/21/2024   RUQ pain 04/19/2024   New daily persistent headache 01/20/2024   Severe obstructive sleep apnea 01/18/2024   Heart murmur 01/08/2024   High platelet count 12/08/2023   Elevated LDL cholesterol level 12/03/2023   Drug-induced constipation 07/31/2023   Type 2 diabetes mellitus without complication, without long-term current use of insulin (HCC) 06/03/2023   Morbid obesity (HCC) 06/03/2023   Seasonal allergic rhinitis due to pollen 09/25/2022   Vitamin D  deficiency 04/01/2022   Polyarthralgia 04/01/2022   Low serum vitamin B12 04/01/2022   Essential hypertension 07/19/2019   Adjustment disorder with mixed anxiety and depressed mood 07/19/2019   Lumbar radiculopathy, chronic 05/05/2019    Allergies: Allergies[1] Medications: Current Medications[2]  Observations/Objective: Patient is well-developed, well-nourished in no acute distress.  Resting comfortably  at home.  Head is normocephalic, atraumatic.  No labored breathing.  Speech is clear and coherent  with logical content.  Patient is alert and oriented at baseline.    Assessment and Plan: 1. Bronchitis (Primary) Increase fluids, humidifier at night, tylenol  or ibuprofen UC as needed.   Follow Up Instructions: I discussed the assessment and treatment plan with the  patient. The patient was provided an opportunity to ask questions and all were answered. The patient agreed with the plan and demonstrated an understanding of the instructions.  A copy of instructions were sent to the patient via MyChart unless otherwise noted below.     The patient was advised to call back or seek an in-person evaluation if the symptoms worsen or if the condition fails to improve as anticipated.    Anberlin Diez, FNP     [1]  Allergies Allergen Reactions   Augmentin  [Amoxicillin -Pot Clavulanate] Nausea And Vomiting  [2]  Current Outpatient Medications:    azithromycin  (ZITHROMAX ) 250 MG tablet, Take 2 tablets on day 1, then 1 tablet daily on days 2 through 5, Disp: 6 tablet, Rfl: 0   predniSONE  (DELTASONE ) 20 MG tablet, Take 1 tablet (20 mg total) by mouth 2 (two) times daily with a meal for 5 days., Disp: 10 tablet, Rfl: 0   acetaminophen  (TYLENOL ) 500 MG tablet, Take 500-1,000 mg by mouth every 6 (six) hours as needed (pain.)., Disp: , Rfl:    Cholecalciferol  (VITAMIN D3) 50 MCG (2000 UT) CAPS, Take 1 capsule by mouth daily., Disp: , Rfl:    cyanocobalamin  (VITAMIN B12) 1000 MCG tablet, Take 1,000 mcg by mouth daily., Disp: , Rfl:    Ferrous Sulfate (SLOW FE PO), Take 1 each by mouth every other day., Disp: , Rfl:    LINZESS  290 MCG CAPS capsule, TAKE 1 CAPSULE BY MOUTH ONCE DAILY BEFORE BREAKFAST, Disp: 30 capsule, Rfl: 0   losartan -hydrochlorothiazide (HYZAAR) 100-25 MG tablet, Take 1 tablet by mouth once daily, Disp: 90 tablet, Rfl: 1   metFORMIN  (GLUCOPHAGE ) 500 MG tablet, TAKE ONE TABLET BY MOUTH ONCE A DAY WITH BREAKFAST., Disp: 90 tablet, Rfl: 1   montelukast  (SINGULAIR ) 10 MG tablet, TAKE ONE TABLET (10 MG TOTAL) BY MOUTH AT BEDTIME. (Patient taking differently: Take 10 mg by mouth in the morning.), Disp: 30 tablet, Rfl: 3   pantoprazole  (PROTONIX ) 40 MG tablet, TAKE ONE TABLET BY MOUTH DAILY, Disp: 90 tablet, Rfl: 1   sertraline  (ZOLOFT ) 100 MG tablet, Take 1  tablet by mouth once daily, Disp: 90 tablet, Rfl: 0   tirzepatide  (MOUNJARO ) 5 MG/0.5ML Pen, After completing four weeks of 2.5 mg qweek [place], start 5.0 mg weekly qweekly (Patient taking differently: Inject 5 mg into the skin every Friday.), Disp: 2 mL, Rfl: 2  Current Facility-Administered Medications:    0.9 %  sodium chloride  infusion, 500 mL, Intravenous, Once, McGreal, Inocente HERO, MD  "

## 2024-05-15 NOTE — Addendum Note (Signed)
 Addended by: GLADIS ELSIE BROCKS on: 05/15/2024 03:43 PM   Modules accepted: Orders

## 2024-05-17 MED ORDER — TIRZEPATIDE 5 MG/0.5ML ~~LOC~~ SOAJ: 5.0000 mg | SUBCUTANEOUS | 0 refills | Status: DC

## 2024-05-17 MED ORDER — TIRZEPATIDE 7.5 MG/0.5ML ~~LOC~~ SOAJ: 7.5000 mg | SUBCUTANEOUS | 0 refills | Status: DC

## 2024-05-17 NOTE — Addendum Note (Signed)
 Addended by: CORWIN ANTU on: 05/17/2024 07:28 AM   Modules accepted: Orders

## 2024-05-17 NOTE — Telephone Encounter (Signed)
 Do you want to send in now or have her call when ready to restart?

## 2024-05-20 NOTE — Progress Notes (Signed)
 Patient is scheduled for PAT on Monday with surgery Tuesday 05/24/2024. However, she just had a virtual visit with PCP on 05/15/2024 for cough, wheezing, green mucus, nasal congestion, fever and night sweats. Diagnosed with bronchitis. Started on Azithromycin  and prednisone . Per Anesthesia she needs to be 2 weeks asymptomatic. Called and left a message with Dr. Normie triage line, [employer] sent to Dr. Polly.  Spoke to patient making her aware, grateful for the call and she will follow up with Dr. Mercedes office to re-schedule.

## 2024-05-20 NOTE — Progress Notes (Signed)
 Surgical Instructions   Your procedure is scheduled on Tuesday May 24, 2024. Report to [facility] Main Entrance A at 5:30 A.M., then check in with the Admitting office. Any questions or running late day of surgery: call [phone]  Questions prior to your surgery date: call [phone], Monday-Friday, 8am-4pm. If you experience any cold or flu symptoms such as cough, fever, chills, shortness of breath, etc. between now and your scheduled surgery, please notify us  at the above number.     Remember:  Do not eat after midnight the night before your surgery   You may drink clear liquids until 4:30 the morning of your surgery.   Clear liquids allowed are: Water, Non-Citrus Juices (without pulp), Carbonated Beverages, Clear Tea (no milk, honey, etc.), Black Coffee Only (NO MILK, CREAM OR POWDERED CREAMER of any kind), and Gatorade.    Take these medicines the morning of surgery with A SIP OF WATER  azithromycin  (ZITHROMAX )  LINZESS   montelukast  (SINGULAIR )  pantoprazole  (PROTONIX )  predniSONE  (DELTASONE )  sertraline  (ZOLOFT )   May take these medicines IF NEEDED: acetaminophen  (TYLENOL )    One week prior to surgery, STOP taking any Aspirin (unless otherwise instructed by your surgeon) Aleve, Naproxen, Ibuprofen, Motrin, Advil, Goody's, BC's, all herbal medications, fish oil, and non-prescription vitamins.   WHAT DO I DO ABOUT MY DIABETES MEDICATION?   Do not take oral diabetes medicines (pills) the morning of surgery.        DO NOT TAKE YOUR metFORMIN  (GLUCOPHAGE ) THE MORNING OF SURGERY.         DO NOT TAKE YOUR tirzepatide  (MOUNJARO ) 7 DAYS PRIOR TO SURGERY, WITH THE LAST DOSE BEING NO LATER THAN 05/16/2024.     The day of surgery, do not take other diabetes injectables, including Byetta (exenatide), Bydureon (exenatide ER), Victoza (liraglutide), or Trulicity (dulaglutide).  If your CBG is greater than 220 mg/dL, you may take  of your sliding scale (correction) dose  of insulin.   HOW TO MANAGE YOUR DIABETES BEFORE AND AFTER SURGERY  Why is it important to control my blood sugar before and after surgery? Improving blood sugar levels before and after surgery helps healing and can limit problems. A way of improving blood sugar control is eating a healthy diet by:  Eating less sugar and carbohydrates  Increasing activity/exercise  Talking with your doctor about reaching your blood sugar goals High blood sugars (greater than 180 mg/dL) can raise your risk of infections and slow your recovery, so you will need to focus on controlling your diabetes during the weeks before surgery. Make sure that the doctor who takes care of your diabetes knows about your planned surgery including the date and location.  How do I manage my blood sugar before surgery? Check your blood sugar at least 4 times a day, starting 2 days before surgery, to make sure that the level is not too high or low.  Check your blood sugar the morning of your surgery when you wake up and every 2 hours until you get to the Short Stay unit.  If your blood sugar is less than 70 mg/dL, you will need to treat for low blood sugar: Do not take insulin. Treat a low blood sugar (less than 70 mg/dL) with  cup of clear juice (cranberry or apple), 4 glucose tablets, OR glucose gel. Recheck blood sugar in 15 minutes after treatment (to make sure it is greater than 70 mg/dL). If your blood sugar is not greater than 70 mg/dL on recheck, call [phone]  for further instructions. Report your blood sugar to the short stay nurse when you get to Short Stay.  If you are admitted to the hospital after surgery: Your blood sugar will be checked by the staff and you will probably be given insulin after surgery (instead of oral diabetes medicines) to make sure you have good blood sugar levels. The goal for blood sugar control after surgery is 80-180 mg/dL.                      Do NOT Smoke (Tobacco/Vaping) for 24  hours prior to your procedure.  If you use a CPAP at night, you may bring your mask/headgear for your overnight stay.   You will be asked to remove any contacts, glasses, piercing's, hearing aid's, dentures/partials prior to surgery. Please bring cases for these items if needed.    Patients discharged the day of surgery will not be allowed to drive home, and someone needs to stay with them for 24 hours.  SURGICAL WAITING ROOM VISITATION Patients may have no more than 2 support people in the waiting area - these visitors may rotate.   Pre-op nurse will coordinate an appropriate time for 1 ADULT support person, who may not rotate, to accompany patient in pre-op.  Children under the age of 70 must have an adult with them who is not the patient and must remain in the main waiting area with an adult.  If the patient needs to stay at the hospital during part of their recovery, the visitor guidelines for inpatient rooms apply.  Please refer to the [facility] website for the visitor guidelines for any additional information.   If you received a COVID test during your pre-op visit  it is requested that you wear a mask when out in public, stay away from anyone that may not be feeling well and notify your surgeon if you develop symptoms. If you have been in contact with anyone that has tested positive in the last 10 days please notify you surgeon.      Pre-operative CHG Bathing Instructions   You can play a key role in reducing the risk of infection after surgery. Your skin needs to be as free of germs as possible. You can reduce the number of germs on your skin by washing with CHG (chlorhexidine gluconate) soap before surgery. CHG is an antiseptic soap that kills germs and continues to kill germs even after washing.   DO NOT use if you have an allergy to chlorhexidine/CHG or antibacterial soaps. If your skin becomes reddened or irritated, stop using the CHG and notify one of our RNs at  [phone].              TAKE A SHOWER THE NIGHT BEFORE SURGERY   Please keep in mind the following:  DO NOT shave, including legs and underarms, 48 hours prior to surgery.   Place clean sheets on your bed the night before surgery Use a clean washcloth (not used since being washed) for shower. DO NOT sleep with pet's night before surgery.  CHG Shower Instructions:  Wash your face and private area with normal soap. If you choose to wash your hair, wash first with your normal shampoo.  After you use shampoo/soap, rinse your hair and body thoroughly to remove shampoo/soap residue.  Turn the water OFF and apply half the bottle of CHG soap to a CLEAN washcloth.  Apply CHG soap ONLY FROM YOUR NECK DOWN TO YOUR TOES (washing  for 3-5 minutes)  DO NOT use CHG soap on face, private areas, open wounds, or sores.  Pay special attention to the area where your surgery is being performed.  If you are having back surgery, having someone wash your back for you may be helpful. Wait 2 minutes after CHG soap is applied, then you may rinse off the CHG soap.  Pat dry with a clean towel  Put on clean pajamas    Additional instructions for the day of surgery: If you choose, you may shower the morning of surgery with an antibacterial soap.  DO NOT APPLY any lotions, deodorants or perfumes.   Do not wear jewelry or makeup Do not wear nail polish, gel polish, artificial nails, or any other type of covering on natural nails (fingers and toes) Do not bring valuables to the hospital. [facility] is not responsible for valuables/personal belongings. Put on clean/comfortable clothes.  Please brush your teeth.  Ask your nurse before applying any prescription medications to the skin.

## 2024-05-23 ENCOUNTER — Inpatient Hospital Stay (HOSPITAL_COMMUNITY)
Admission: RE | Admit: 2024-05-23 | Discharge: 2024-05-23 | Disposition: A | Discharge status: Home / Self Care | Source: Ambulatory Visit

## 2024-05-24 ENCOUNTER — Encounter (HOSPITAL_COMMUNITY): Admission: RE | Payer: Self-pay | Source: Home / Self Care

## 2024-05-24 ENCOUNTER — Ambulatory Visit (HOSPITAL_COMMUNITY): Admission: RE | Admit: 2024-05-24 | Source: Home / Self Care | Admitting: General Surgery

## 2024-05-24 SURGERY — LAPAROSCOPIC CHOLECYSTECTOMY
Anesthesia: General

## 2024-05-27 ENCOUNTER — Other Ambulatory Visit: Payer: Self-pay | Admitting: Pediatrics

## 2024-06-09 ENCOUNTER — Other Ambulatory Visit: Payer: Self-pay | Admitting: Pediatrics

## 2024-06-16 ENCOUNTER — Encounter: Payer: Self-pay | Admitting: Family

## 2024-06-16 DIAGNOSIS — R110 Nausea: Secondary | ICD-10-CM

## 2024-06-16 DIAGNOSIS — E119 Type 2 diabetes mellitus without complications: Secondary | ICD-10-CM

## 2024-06-20 ENCOUNTER — Telehealth: Payer: Self-pay | Admitting: Pharmacy Technician

## 2024-06-20 NOTE — Telephone Encounter (Signed)
 Pharmacy Patient Advocate Encounter   Received notification from [facility] KEY that prior authorization for Ozempic  (0.25 or 0.5 MG/DOSE) 2MG /3ML pen-injectors is due for renewal.   Insurance verification completed.   The patient is insured through [facility].  Action: Medication has been discontinued. Archived Key: [id number]

## 2024-06-22 MED ORDER — TIRZEPATIDE 2.5 MG/0.5ML ~~LOC~~ SOAJ: 2.5000 mg | SUBCUTANEOUS | 1 refills | Status: AC

## 2024-06-22 MED ORDER — ONDANSETRON HCL 4 MG PO TABS: 4.0000 mg | ORAL_TABLET | Freq: Three times a day (TID) | ORAL | 0 refills | Status: AC | PRN

## 2024-06-22 NOTE — Addendum Note (Signed)
 Addended by: CORWIN ANTU on: 06/22/2024 11:31 AM   Modules accepted: Orders

## 2024-06-23 ENCOUNTER — Other Ambulatory Visit: Payer: Self-pay | Admitting: Family

## 2024-06-23 DIAGNOSIS — Z1231 Encounter for screening mammogram for malignant neoplasm of breast: Secondary | ICD-10-CM

## 2024-06-27 ENCOUNTER — Ambulatory Visit: Admitting: Pediatrics

## 2024-06-29 ENCOUNTER — Encounter

## 2024-07-28 ENCOUNTER — Ambulatory Visit: Admitting: Pediatrics

## 2024-08-11 ENCOUNTER — Encounter
